# Patient Record
Sex: Female | Born: 1985
Health system: Southern US, Community
[De-identification: ages and names within clinical notes are randomized; demographics above are authoritative.]

## PROBLEM LIST (undated history)

## (undated) DIAGNOSIS — Z789 Other specified health status: Secondary | ICD-10-CM

## (undated) HISTORY — PX: NO PAST SURGERIES: SHX2092

## (undated) HISTORY — DX: Other specified health status: Z78.9

---

## 2013-02-05 ENCOUNTER — Encounter: Payer: Self-pay | Admitting: *Deleted

## 2013-02-12 ENCOUNTER — Ambulatory Visit (INDEPENDENT_AMBULATORY_CARE_PROVIDER_SITE_OTHER): Payer: Medicaid Other | Admitting: Family Medicine

## 2013-02-12 ENCOUNTER — Other Ambulatory Visit (HOSPITAL_COMMUNITY)
Admission: RE | Admit: 2013-02-12 | Discharge: 2013-02-12 | Disposition: A | Discharge status: Home / Self Care | Payer: Medicaid Other | Source: Ambulatory Visit | Attending: Family Medicine | Admitting: Family Medicine

## 2013-02-12 ENCOUNTER — Encounter: Payer: Self-pay | Admitting: Family Medicine

## 2013-02-12 VITALS — BP 128/86 | Temp 98.0°F | Ht 65.5 in | Wt 194.4 lb

## 2013-02-12 DIAGNOSIS — Z1151 Encounter for screening for human papillomavirus (HPV): Secondary | ICD-10-CM | POA: Insufficient documentation

## 2013-02-12 DIAGNOSIS — O09892 Supervision of other high risk pregnancies, second trimester: Secondary | ICD-10-CM

## 2013-02-12 DIAGNOSIS — Z23 Encounter for immunization: Secondary | ICD-10-CM

## 2013-02-12 DIAGNOSIS — O09212 Supervision of pregnancy with history of pre-term labor, second trimester: Secondary | ICD-10-CM

## 2013-02-12 DIAGNOSIS — O0990 Supervision of high risk pregnancy, unspecified, unspecified trimester: Secondary | ICD-10-CM | POA: Insufficient documentation

## 2013-02-12 DIAGNOSIS — Z01419 Encounter for gynecological examination (general) (routine) without abnormal findings: Secondary | ICD-10-CM | POA: Insufficient documentation

## 2013-02-12 DIAGNOSIS — O09219 Supervision of pregnancy with history of pre-term labor, unspecified trimester: Secondary | ICD-10-CM

## 2013-02-12 DIAGNOSIS — Z113 Encounter for screening for infections with a predominantly sexual mode of transmission: Secondary | ICD-10-CM | POA: Insufficient documentation

## 2013-02-12 LAB — HIV ANTIBODY (ROUTINE TESTING W REFLEX): HIV: NONREACTIVE

## 2013-02-12 LAB — POCT URINALYSIS DIP (DEVICE)
Glucose, UA: NEGATIVE mg/dL
Hgb urine dipstick: NEGATIVE
Ketones, ur: NEGATIVE mg/dL
Protein, ur: NEGATIVE mg/dL
Specific Gravity, Urine: 1.015 (ref 1.005–1.030)
Urobilinogen, UA: 0.2 mg/dL (ref 0.0–1.0)
pH: 7 (ref 5.0–8.0)

## 2013-02-12 MED ORDER — PRENATAL VITAMINS 0.8 MG PO TABS: 1.0000 | ORAL_TABLET | Freq: Every day | ORAL | Status: DC

## 2013-02-12 MED ORDER — HYDROXYPROGESTERONE CAPROATE 250 MG/ML IM OIL: 250.0000 mg | TOPICAL_OIL | INTRAMUSCULAR | Status: DC

## 2013-02-12 NOTE — Progress Notes (Signed)
   Subjective:    Cheryl Wilkerson is a [id number] [redacted]w[redacted]d being seen today for her first obstetrical visit.  Her obstetrical history is significant for prior PTB with twins at 24 wks, each baby weighed 1 lb, one survived.. Patient does intend to breast feed. Pregnancy history fully reviewed.  Patient reports no complaints.  Filed Vitals:   02/12/13 1013 02/12/13 1015  BP: 128/86   Temp: 98 F (36.7 C)   Height:  5' 5.5" (1.664 m)  Weight: 194 lb 6.4 oz (88.179 kg)     HISTORY: OB History  Gravida Para Term Preterm AB SAB TAB Ectopic Multiple Living  3 1  1 1 1   1 1     # Outcome Date GA Lbr Len/2nd Weight Sex Delivery Anes PTL Lv  3 CUR           2A PRE 12/23/11 [redacted]w[redacted]d   F SVD   Y     Comments: had premature rupture of membranes  2B  12/23/11 [redacted]w[redacted]d   F SVD   ND     Comments: died at 90 weeks of age  70 SAB              Past Medical History  Diagnosis Date  . Medical history non-contributory    Past Surgical History  Procedure Laterality Date  . No past surgeries     History reviewed. No pertinent family history.   Exam    Uterus:     Pelvic Exam: 24 wk size   Perineum: Normal Perineum   Vulva: Bartholin's, Urethra, Skene's normal   Vagina:  normal mucosa, normal discharge   Cervix: multiparous appearance   Adnexa: normal adnexa   Bony Pelvis: average  System: Breast:  normal appearance, no masses or tenderness   Skin: normal coloration and turgor, no rashes    Neurologic: oriented   Extremities: normal strength, tone, and muscle mass   HEENT sclera clear, anicteric   Mouth/Teeth mucous membranes moist, pharynx normal without lesions   Neck supple   Cardiovascular: regular rate and rhythm, no murmurs or gallops   Respiratory:  appears well, vitals normal, no respiratory distress, acyanotic, normal RR, ear and throat exam is normal, neck free of mass or lymphadenopathy, chest clear, no wheezing, crepitations, rhonchi, normal symmetric air entry   Abdomen: soft, non-tender;  bowel sounds normal; no masses,  no organomegaly      Assessment:    Pregnancy: [id number] Patient Active Problem List   Diagnosis Date Noted  . Pregnancy with history of pre-term labor 02/12/2013  . Supervision of high-risk pregnancy 02/12/2013        Plan:     Initial labs drawn. Prenatal vitamins. Problem list reviewed and updated. Genetic Screening discussed Quad Screen: too late.  Ultrasound discussed; fetal survey: ordered.  Follow up in 2 weeks. Offer and begin 17P Flu shot today SW and Nutrition New OB labs.     Esdras Delair S 02/12/2013

## 2013-02-12 NOTE — Progress Notes (Signed)
Pulse: 79

## 2013-02-12 NOTE — Patient Instructions (Signed)
Pregnancy - Second Trimester The second trimester of pregnancy (3 to 6 months) is a period of rapid growth for you and your baby. At the end of the sixth month, your baby is about 9 inches long and weighs 1 1/2 pounds. You will begin to feel the baby move between 18 and 20 weeks of the pregnancy. This is called quickening. Weight gain is faster. A clear fluid (colostrum) may leak out of your breasts. You may feel small contractions of the womb (uterus). This is known as false labor or Braxton-Hicks contractions. This is like a practice for labor when the baby is ready to be born. Usually, the problems with morning sickness have usually passed by the end of your first trimester. Some women develop small dark blotches (called cholasma, mask of pregnancy) on their face that usually goes away after the baby is born. Exposure to the sun makes the blotches worse. Acne may also develop in some pregnant women and pregnant women who have acne, may find that it goes away. PRENATAL EXAMS  Blood work may continue to be done during prenatal exams. These tests are done to check on your health and the probable health of your baby. Blood work is used to follow your blood levels (hemoglobin). Anemia (low hemoglobin) is common during pregnancy. Iron and vitamins are given to help prevent this. You will also be checked for diabetes between 24 and 28 weeks of the pregnancy. Some of the previous blood tests may be repeated.  The size of the uterus is measured during each visit. This is to make sure that the baby is continuing to grow properly according to the dates of the pregnancy.  Your blood pressure is checked every prenatal visit. This is to make sure you are not getting toxemia.  Your urine is checked to make sure you do not have an infection, diabetes or protein in the urine.  Your weight is checked often to make sure gains are happening at the suggested rate. This is to ensure that both you and your baby are  growing normally.  Sometimes, an ultrasound is performed to confirm the proper growth and development of the baby. This is a test which bounces harmless sound waves off the baby so your caregiver can more accurately determine due dates. Sometimes, a test is done on the amniotic fluid surrounding the baby. This test is called an amniocentesis. The amniotic fluid is obtained by sticking a needle into the belly (abdomen). This is done to check the chromosomes in instances where there is a concern about possible genetic problems with the baby. It is also sometimes done near the end of pregnancy if an early delivery is required. In this case, it is done to help make sure the baby's lungs are mature enough for the baby to live outside of the womb. CHANGES OCCURING IN THE SECOND TRIMESTER OF PREGNANCY Your body goes through many changes during pregnancy. They vary from person to person. Talk to your caregiver about changes you notice that you are concerned about.  During the second trimester, you will likely have an increase in your appetite. It is normal to have cravings for certain foods. This varies from person to person and pregnancy to pregnancy.  Your lower abdomen will begin to bulge.  You may have to urinate more often because the uterus and baby are pressing on your bladder. It is also common to get more bladder infections during pregnancy. You can help this by drinking lots of fluids   and emptying your bladder before and after intercourse.  You may begin to get stretch marks on your hips, abdomen, and breasts. These are normal changes in the body during pregnancy. There are no exercises or medicines to take that prevent this change.  You may begin to develop swollen and bulging veins (varicose veins) in your legs. Wearing support hose, elevating your feet for 15 minutes, 3 to 4 times a day and limiting salt in your diet helps lessen the problem.  Heartburn may develop as the uterus grows and  pushes up against the stomach. Antacids recommended by your caregiver helps with this problem. Also, eating smaller meals 4 to 5 times a day helps.  Constipation can be treated with a stool softener or adding bulk to your diet. Drinking lots of fluids, and eating vegetables, fruits, and whole grains are helpful.  Exercising is also helpful. If you have been very active up until your pregnancy, most of these activities can be continued during your pregnancy. If you have been less active, it is helpful to start an exercise program such as walking.  Hemorrhoids may develop at the end of the second trimester. Warm sitz baths and hemorrhoid cream recommended by your caregiver helps hemorrhoid problems.  Backaches may develop during this time of your pregnancy. Avoid heavy lifting, wear low heal shoes, and practice good posture to help with backache problems.  Some pregnant women develop tingling and numbness of their hand and fingers because of swelling and tightening of ligaments in the wrist (carpel tunnel syndrome). This goes away after the baby is born.  As your breasts enlarge, you may have to get a bigger bra. Get a comfortable, cotton, support bra. Do not get a nursing bra until the last month of the pregnancy if you will be nursing the baby.  You may get a dark line from your belly button to the pubic area called the linea nigra.  You may develop rosy cheeks because of increase blood flow to the face.  You may develop spider looking lines of the face, neck, arms, and chest. These go away after the baby is born. HOME CARE INSTRUCTIONS   It is extremely important to avoid all smoking, herbs, alcohol, and unprescribed drugs during your pregnancy. These chemicals affect the formation and growth of the baby. Avoid these chemicals throughout the pregnancy to ensure the delivery of a healthy infant.  Most of your home care instructions are the same as suggested for the first trimester of your  pregnancy. Keep your caregiver's appointments. Follow your caregiver's instructions regarding medicine use, exercise, and diet.  During pregnancy, you are providing food for you and your baby. Continue to eat regular, well-balanced meals. Choose foods such as meat, fish, milk and other low fat dairy products, vegetables, fruits, and whole-grain breads and cereals. Your caregiver will tell you of the ideal weight gain.  A physical sexual relationship may be continued up until near the end of pregnancy if there are no other problems. Problems could include early (premature) leaking of amniotic fluid from the membranes, vaginal bleeding, abdominal pain, or other medical or pregnancy problems.  Exercise regularly if there are no restrictions. Check with your caregiver if you are unsure of the safety of some of your exercises. The greatest weight gain will occur in the last 2 trimesters of pregnancy. Exercise will help you:  Control your weight.  Get you in shape for labor and delivery.  Lose weight after you have the baby.  Wear   a good support or jogging bra for breast tenderness during pregnancy. This may help if worn during sleep. Pads or tissues may be used in the bra if you are leaking colostrum.  Do not use hot tubs, steam rooms or saunas throughout the pregnancy.  Wear your seat belt at all times when driving. This protects you and your baby if you are in an accident.  Avoid raw meat, uncooked cheese, cat litter boxes, and soil used by cats. These carry germs that can cause birth defects in the baby.  The second trimester is also a good time to visit your dentist for your dental health if this has not been done yet. Getting your teeth cleaned is okay. Use a soft toothbrush. Brush gently during pregnancy.  It is easier to leak urine during pregnancy. Tightening up and strengthening the pelvic muscles will help with this problem. Practice stopping your urination while you are going to the  bathroom. These are the same muscles you need to strengthen. It is also the muscles you would use as if you were trying to stop from passing gas. You can practice tightening these muscles up 10 times a set and repeating this about 3 times per day. Once you know what muscles to tighten up, do not perform these exercises during urination. It is more likely to contribute to an infection by backing up the urine.  Ask for help if you have financial, counseling, or nutritional needs during pregnancy. Your caregiver will be able to offer counseling for these needs as well as refer you for other special needs.  Your skin may become oily. If so, wash your face with mild soap, use non-greasy moisturizer and oil or cream based makeup. MEDICINES AND DRUG USE IN PREGNANCY  Take prenatal vitamins as directed. The vitamin should contain 1 milligram of folic acid. Keep all vitamins out of reach of children. Only a couple vitamins or tablets containing iron may be fatal to a baby or young child when ingested.  Avoid use of all medicines, including herbs, over-the-counter medicines, not prescribed or suggested by your caregiver. Only take over-the-counter or prescription medicines for pain, discomfort, or fever as directed by your caregiver. Do not use aspirin.  Let your caregiver also know about herbs you may be using.  Alcohol is related to a number of birth defects. This includes fetal alcohol syndrome. All alcohol, in any form, should be avoided completely. Smoking will cause low birth rate and premature babies.  Street or illegal drugs are very harmful to the baby. They are absolutely forbidden. A baby born to an addicted mother will be addicted at birth. The baby will go through the same withdrawal an adult does. SEEK MEDICAL CARE IF:  You have any concerns or worries during your pregnancy. It is better to call with your questions if you feel they cannot wait, rather than worry about them. SEEK IMMEDIATE  MEDICAL CARE IF:   An unexplained oral temperature above 102 F (38.9 C) develops, or as your caregiver suggests.  You have leaking of fluid from the vagina (birth canal). If leaking membranes are suspected, take your temperature and tell your caregiver of this when you call.  There is vaginal spotting, bleeding, or passing clots. Tell your caregiver of the amount and how many pads are used. Light spotting in pregnancy is common, especially following intercourse.  You develop a bad smelling vaginal discharge with a change in the color from clear to white.  You continue to feel   sick to your stomach (nauseated) and have no relief from remedies suggested. You vomit blood or coffee ground-like materials.  You lose more than 2 pounds of weight or gain more than 2 pounds of weight over 1 week, or as suggested by your caregiver.  You notice swelling of your face, hands, feet, or legs.  You get exposed to German measles and have never had them.  You are exposed to fifth disease or chickenpox.  You develop belly (abdominal) pain. Round ligament discomfort is a common non-cancerous (benign) cause of abdominal pain in pregnancy. Your caregiver still must evaluate you.  You develop a bad headache that does not go away.  You develop fever, diarrhea, pain with urination, or shortness of breath.  You develop visual problems, blurry, or double vision.  You fall or are in a car accident or any kind of trauma.  There is mental or physical violence at home. Document Released: 04/17/2001 Document Revised: 01/16/2012 Document Reviewed: 10/20/2008 [employer] Patient Information 2014 [employer].  Breastfeeding A change in hormones during your pregnancy causes growth of your breast tissue and an increase in number and size of milk ducts. The hormone prolactin allows proteins, sugars, and fats from your blood supply to make breast milk in your milk-producing glands. The hormone progesterone prevents  breast milk from being released before the birth of your baby. After the birth of your baby, your progesterone level decreases allowing breast milk to be released. Thoughts of your baby, as well as his or her sucking or crying, can stimulate the release of milk from the milk-producing glands. Deciding to breastfeed (nurse) is one of the best choices you can make for you and your baby. The information that follows gives a brief review of the benefits, as well as other important skills to know about breastfeeding. BENEFITS OF BREASTFEEDING For your baby  The first milk (colostrum) helps your baby's digestive system function better.   There are antibodies in your milk that help your baby fight off infections.   Your baby has a lower incidence of asthma, allergies, and sudden infant death syndrome (SIDS).   The nutrients in breast milk are better for your baby than infant formulas.  Breast milk improves your baby's brain development.   Your baby will have less gas, colic, and constipation.  Your baby is less likely to develop other conditions, such as childhood obesity, asthma, or diabetes mellitus. For you  Breastfeeding helps develop a very special bond between you and your baby.   Breastfeeding is convenient, always available at the correct temperature, and costs nothing.   Breastfeeding helps to burn calories and helps you lose the weight gained during pregnancy.   Breastfeeding makes your uterus contract back down to normal size faster and slows bleeding following delivery.   Breastfeeding mothers have a lower risk of developing osteoporosis or breast or ovarian cancer later in life.  BREASTFEEDING FREQUENCY  A healthy, full-term baby may breastfeed as often as every hour or space his or her feedings to every 3 hours. Breastfeeding frequency will vary from baby to baby.   Newborns should be fed no less than every 2 3 hours during the day and every 4 5 hours during the  night. You should breastfeed a minimum of 8 feedings in a 24 hour period.  Awaken your baby to breastfeed if it has been 3 4 hours since the last feeding.  Breastfeed when you feel the need to reduce the fullness of your breasts or when   your newborn shows signs of hunger. Signs that your baby may be hungry include:  Increased alertness or activity.  Stretching.  Movement of the head from side to side.  Movement of the head and opening of the mouth when the corner of the mouth or cheek is stroked (rooting).  Increased sucking sounds, smacking lips, cooing, sighing, or squeaking.  Hand-to-mouth movements.  Increased sucking of fingers or hands.  Fussing.  Intermittent crying.  Signs of extreme hunger will require calming and consoling before you try to feed your baby. Signs of extreme hunger may include:  Restlessness.  A loud, strong cry.  Screaming.  Frequent feeding will help you make more milk and will help prevent problems, such as sore nipples and engorgement of the breasts.  BREASTFEEDING   Whether lying down or sitting, be sure that the baby's abdomen is facing your abdomen.   Support your breast with 4 fingers under your breast and your thumb above your nipple. Make sure your fingers are well away from your nipple and your baby's mouth.   Stroke your baby's lips gently with your finger or nipple.   When your baby's mouth is open wide enough, place all of your nipple and as much of the colored area around your nipple (areola) as possible into your baby's mouth.  More areola should be visible above his or her upper lip than below his or her lower lip.  Your baby's tongue should be between his or her lower gum and your breast.  Ensure that your baby's mouth is correctly positioned around the nipple (latched). Your baby's lips should create a seal on your breast.  Signs that your baby has effectively latched onto your nipple include:  Tugging or sucking  without pain.  Swallowing heard between sucks.  Absent click or smacking sound.  Muscle movement above and in front of his or her ears with sucking.  Your baby must suck about 2 3 minutes in order to get your milk. Allow your baby to feed on each breast as long as he or she wants. Nurse your baby until he or she unlatches or falls asleep at the first breast, then offer the second breast.  Signs that your baby is full and satisfied include:  A gradual decrease in the number of sucks or complete cessation of sucking.  Falling asleep.  Extension or relaxation of his or her body.  Retention of a small amount of milk in his or her mouth.  Letting go of your breast by himself or herself.  Signs of effective breastfeeding in you include:  Breasts that have increased firmness, weight, and size prior to feeding.  Breasts that are softer after nursing.  Increased milk volume, as well as a change in milk consistency and color by the 5th day of breastfeeding.  Breast fullness relieved by breastfeeding.  Nipples are not sore, cracked, or bleeding.  If needed, break the suction by putting your finger into the corner of your baby's mouth and sliding your finger between his or her gums. Then, remove your breast from his or her mouth.  It is common for babies to spit up a small amount after a feeding.  Babies often swallow air during feeding. This can make babies fussy. Burping your baby between breasts can help with this.  Vitamin D supplements are recommended for babies who get only breast milk.  Avoid using a pacifier during your baby's first 4 6 weeks.  Avoid supplemental feedings of water, formula, or   juice in place of breastfeeding. Breast milk is all the food your baby needs. It is not necessary for your baby to have water or formula. Your breasts will make more milk if supplemental feedings are avoided during the early weeks. HOW TO TELL WHETHER YOUR BABY IS GETTING ENOUGH BREAST  MILK Wondering whether or not your baby is getting enough milk is a common concern among mothers. You can be assured that your baby is getting enough milk if:   Your baby is actively sucking and you hear swallowing.   Your baby seems relaxed and satisfied after a feeding.   Your baby nurses at least 8 12 times in a 24 hour time period.  During the first 3 5 days of age:  Your baby is wetting at least 3 5 diapers in a 24 hour period. The urine should be clear and pale yellow.  Your baby is having at least 3 4 stools in a 24 hour period. The stool should be soft and yellow.  At 5 7 days of age, your baby is having at least 3 6 stools in a 24 hour period. The stool should be seedy and yellow by 5 days of age.  Your baby has a weight loss less than 7 10% during the first 3 days of age.  Your baby does not lose weight after 3 7 days of age.  Your baby gains 4 7 ounces each week after he or she is 4 days of age.  Your baby gains weight by 5 days of age and is back to birth weight within 2 weeks. ENGORGEMENT In the first week after your baby is born, you may experience extremely full breasts (engorgement). When engorged, your breasts may feel heavy, warm, or tender to the touch. Engorgement peaks within 24 48 hours after delivery of your baby.  Engorgement may be reduced by:  Continuing to breastfeed.  Increasing the frequency of breastfeeding.  Taking warm showers or applying warm, moist heat to your breasts just before each feeding. This increases circulation and helps the milk flow.   Gently massaging your breast before and during the feedings. With your fingertips, massage from your chest wall towards your nipple in a circular motion.   Ensuring that your baby empties at least one breast at every feeding. It also helps to start the next feeding on the opposite breast.   Expressing breast milk by hand or by using a breast pump to empty the breasts if your baby is sleepy, or  not nursing well. You may also want to express milk if you are returning to work oryou feel you are getting engorged.  Ensuring your baby is latched on and positioned properly while breastfeeding. If you follow these suggestions, your engorgement should improve in 24 48 hours. If you are still experiencing difficulty, call your lactation consultant or caregiver.  CARING FOR YOURSELF Take care of your breasts.  Bathe or shower daily.   Avoid using soap on your nipples.   Wear a supportive bra. Avoid wearing underwire style bras.  Air dry your nipples for a 3 4minutes after each feeding.   Use only cotton bra pads to absorb breast milk leakage. Leaking of breast milk between feedings is normal.   Use only pure lanolin on your nipples after nursing. You do not need to wash it off before feeding your baby again. Another option is to express a few drops of breast milk and gently massage that milk into your nipples.  Continue   breast self-awareness checks. Take care of yourself.  Eat healthy foods. Alternate 3 meals with 3 snacks.  Avoid foods that you notice affect your baby in a bad way.  Drink milk, fruit juice, and water to satisfy your thirst (about 8 glasses a day).   Rest often, relax, and take your prenatal vitamins to prevent fatigue, stress, and anemia.  Avoid chewing and smoking tobacco.  Avoid alcohol and drug use.  Take over-the-counter and prescribed medicine only as directed by your caregiver or pharmacist. You should always check with your caregiver or pharmacist before taking any new medicine, vitamin, or herbal supplement.  Know that pregnancy is possible while breastfeeding. If desired, talk to your caregiver about family planning and safe birth control methods that may be used while breastfeeding. SEEK MEDICAL CARE IF:   You feel like you want to stop breastfeeding or have become frustrated with breastfeeding.  You have painful breasts or nipples.  Your  nipples are cracked or bleeding.  Your breasts are red, tender, or warm.  You have a swollen area on either breast.  You have a fever or chills.  You have nausea or vomiting.  You have drainage from your nipples.  Your breasts do not become full before feedings by the 5th day after delivery.  You feel sad and depressed.  Your baby is too sleepy to eat well.  Your baby is having trouble sleeping.   Your baby is wetting less than 3 diapers in a 24 hour period.  Your baby has less than 3 stools in a 24 hour period.  Your baby's skin or the white part of his or her eyes becomes more yellow.   Your baby is not gaining weight by 5 days of age. MAKE SURE YOU:   Understand these instructions.  Will watch your condition.  Will get help right away if you are not doing well or get worse. Document Released: 04/23/2005 Document Revised: 01/16/2012 Document Reviewed: 11/28/2011 [employer] Patient Information 2014 [employer].  

## 2013-02-12 NOTE — Progress Notes (Signed)
Nutrition: Pt here for first OB visit today. Pt was obese pregavid.  Wt gain is WNL today. No N/V reported.  No reported food allergies. Pt reports good appetite and adequate intake- eating 3 meals/day. Pt walking for one hour most days. No PNV yet.  Discussed wt gain goal of 11-20# or 0.5#/week. Agrees to continue healthy diet and exercise as reported.   Pt already has [facility]. F/u if referred.  Melanee Left, MPH, RD, LDN 02/12/2013

## 2013-02-12 NOTE — Progress Notes (Signed)
U/S scheduled 02/16/13 at 2 pm.

## 2013-02-13 ENCOUNTER — Encounter: Payer: Self-pay | Admitting: *Deleted

## 2013-02-13 LAB — OBSTETRIC PANEL
Basophils Absolute: 0 10*3/uL (ref 0.0–0.1)
Basophils Relative: 0 % (ref 0–1)
Eosinophils Absolute: 0.1 10*3/uL (ref 0.0–0.7)
Eosinophils Relative: 1 % (ref 0–5)
HCT: 36.1 % (ref 36.0–46.0)
Hemoglobin: 12.6 g/dL (ref 12.0–15.0)
Hepatitis B Surface Ag: NEGATIVE
MCH: 28.6 pg (ref 26.0–34.0)
MCHC: 34.9 g/dL (ref 30.0–36.0)
MCV: 82 fL (ref 78.0–100.0)
Monocytes Relative: 6 % (ref 3–12)
Neutrophils Relative %: 68 % (ref 43–77)
Platelets: 272 10*3/uL (ref 150–400)
RBC: 4.4 MIL/uL (ref 3.87–5.11)
RDW: 14.1 % (ref 11.5–15.5)
Rh Type: POSITIVE

## 2013-02-14 LAB — CULTURE, OB URINE

## 2013-02-16 ENCOUNTER — Ambulatory Visit (HOSPITAL_COMMUNITY)
Admission: RE | Admit: 2013-02-16 | Discharge: 2013-02-16 | Disposition: A | Discharge status: Home / Self Care | Payer: Medicaid Other | Source: Ambulatory Visit | Attending: Family Medicine | Admitting: Family Medicine

## 2013-02-16 ENCOUNTER — Ambulatory Visit (HOSPITAL_COMMUNITY): Admission: RE | Admit: 2013-02-16 | Payer: Medicaid Other | Source: Ambulatory Visit

## 2013-02-16 DIAGNOSIS — Z363 Encounter for antenatal screening for malformations: Secondary | ICD-10-CM | POA: Insufficient documentation

## 2013-02-16 DIAGNOSIS — O0930 Supervision of pregnancy with insufficient antenatal care, unspecified trimester: Secondary | ICD-10-CM | POA: Insufficient documentation

## 2013-02-16 DIAGNOSIS — O09892 Supervision of other high risk pregnancies, second trimester: Secondary | ICD-10-CM

## 2013-02-16 DIAGNOSIS — O358XX0 Maternal care for other (suspected) fetal abnormality and damage, not applicable or unspecified: Secondary | ICD-10-CM | POA: Insufficient documentation

## 2013-02-16 DIAGNOSIS — O0990 Supervision of high risk pregnancy, unspecified, unspecified trimester: Secondary | ICD-10-CM

## 2013-02-16 DIAGNOSIS — Z1389 Encounter for screening for other disorder: Secondary | ICD-10-CM | POA: Insufficient documentation

## 2013-02-16 DIAGNOSIS — Z23 Encounter for immunization: Secondary | ICD-10-CM

## 2013-02-16 DIAGNOSIS — Z8751 Personal history of pre-term labor: Secondary | ICD-10-CM | POA: Insufficient documentation

## 2013-02-16 DIAGNOSIS — O09212 Supervision of pregnancy with history of pre-term labor, second trimester: Secondary | ICD-10-CM

## 2013-02-17 LAB — HEMOGLOBINOPATHY EVALUATION
Hemoglobin Other: 0 %
Hgb A2 Quant: 3 % (ref 2.2–3.2)
Hgb A: 97 % (ref 96.8–97.8)
Hgb F Quant: 0 % (ref 0.0–2.0)
Hgb S Quant: 0 %

## 2013-02-19 ENCOUNTER — Ambulatory Visit: Payer: Medicaid Other

## 2013-02-19 ENCOUNTER — Ambulatory Visit (INDEPENDENT_AMBULATORY_CARE_PROVIDER_SITE_OTHER): Payer: Medicaid Other | Admitting: *Deleted

## 2013-02-19 VITALS — BP 142/84 | HR 91 | Temp 97.2°F | Wt 197.5 lb

## 2013-02-19 DIAGNOSIS — O09212 Supervision of pregnancy with history of pre-term labor, second trimester: Secondary | ICD-10-CM

## 2013-02-19 DIAGNOSIS — O09219 Supervision of pregnancy with history of pre-term labor, unspecified trimester: Secondary | ICD-10-CM

## 2013-02-19 MED ORDER — HYDROXYPROGESTERONE CAPROATE 250 MG/ML IM OIL
250.0000 mg | TOPICAL_OIL | Freq: Once | INTRAMUSCULAR | Status: AC
  Administered 2013-02-19: 250 mg via INTRAMUSCULAR

## 2013-02-23 ENCOUNTER — Encounter: Payer: Self-pay | Admitting: *Deleted

## 2013-02-26 ENCOUNTER — Ambulatory Visit (INDEPENDENT_AMBULATORY_CARE_PROVIDER_SITE_OTHER): Payer: Medicaid Other | Admitting: Obstetrics & Gynecology

## 2013-02-26 VITALS — BP 125/83 | Temp 97.0°F | Wt 193.9 lb

## 2013-02-26 DIAGNOSIS — O09219 Supervision of pregnancy with history of pre-term labor, unspecified trimester: Secondary | ICD-10-CM

## 2013-02-26 DIAGNOSIS — O09212 Supervision of pregnancy with history of pre-term labor, second trimester: Secondary | ICD-10-CM

## 2013-02-26 LAB — POCT URINALYSIS DIP (DEVICE)
Bilirubin Urine: NEGATIVE
Ketones, ur: NEGATIVE mg/dL
Protein, ur: NEGATIVE mg/dL
Specific Gravity, Urine: 1.015 (ref 1.005–1.030)
pH: 7 (ref 5.0–8.0)

## 2013-02-26 MED ORDER — HYDROXYPROGESTERONE CAPROATE 250 MG/ML IM OIL
250.0000 mg | TOPICAL_OIL | INTRAMUSCULAR | Status: DC
  Administered 2013-02-26 – 2013-04-27 (×8): 250 mg via INTRAMUSCULAR

## 2013-02-26 NOTE — Patient Instructions (Signed)

## 2013-02-26 NOTE — Progress Notes (Signed)
17 p today. No UC, some llq pain when walking

## 2013-02-26 NOTE — Progress Notes (Signed)
Pulse- 90  Pain-pressure

## 2013-03-04 ENCOUNTER — Encounter: Payer: Self-pay | Admitting: *Deleted

## 2013-03-05 ENCOUNTER — Ambulatory Visit (INDEPENDENT_AMBULATORY_CARE_PROVIDER_SITE_OTHER): Payer: Medicaid Other

## 2013-03-05 VITALS — BP 120/85 | HR 86 | Temp 97.3°F | Wt 195.4 lb

## 2013-03-05 DIAGNOSIS — O09219 Supervision of pregnancy with history of pre-term labor, unspecified trimester: Secondary | ICD-10-CM

## 2013-03-05 DIAGNOSIS — O09212 Supervision of pregnancy with history of pre-term labor, second trimester: Secondary | ICD-10-CM

## 2013-03-05 NOTE — Progress Notes (Signed)
Interpreter present with patient. Pt. States she is feeling the baby move good and all the time. Denies any concerns at this tine. Pt. To get 1hr at next visit. Tolerated 17P injection well.

## 2013-03-12 ENCOUNTER — Ambulatory Visit (INDEPENDENT_AMBULATORY_CARE_PROVIDER_SITE_OTHER): Payer: Medicaid Other | Admitting: Family

## 2013-03-12 ENCOUNTER — Encounter: Payer: Self-pay | Admitting: Family

## 2013-03-12 VITALS — BP 121/79 | Wt 199.8 lb

## 2013-03-12 DIAGNOSIS — O0990 Supervision of high risk pregnancy, unspecified, unspecified trimester: Secondary | ICD-10-CM

## 2013-03-12 DIAGNOSIS — Z23 Encounter for immunization: Secondary | ICD-10-CM

## 2013-03-12 DIAGNOSIS — O09219 Supervision of pregnancy with history of pre-term labor, unspecified trimester: Secondary | ICD-10-CM

## 2013-03-12 DIAGNOSIS — O09213 Supervision of pregnancy with history of pre-term labor, third trimester: Secondary | ICD-10-CM

## 2013-03-12 LAB — POCT URINALYSIS DIP (DEVICE)
Nitrite: NEGATIVE
Protein, ur: NEGATIVE mg/dL
Specific Gravity, Urine: 1.02 (ref 1.005–1.030)
Urobilinogen, UA: 0.2 mg/dL (ref 0.0–1.0)

## 2013-03-12 LAB — CBC
HCT: 35 % — ABNORMAL LOW (ref 36.0–46.0)
MCHC: 34 g/dL (ref 30.0–36.0)
Platelets: 279 10*3/uL (ref 150–400)
RBC: 4.21 MIL/uL (ref 3.87–5.11)
RDW: 15 % (ref 11.5–15.5)

## 2013-03-12 MED ORDER — NITROFURANTOIN MONOHYD MACRO 100 MG PO CAPS: 100.0000 mg | ORAL_CAPSULE | Freq: Two times a day (BID) | ORAL | Status: DC

## 2013-03-12 MED ORDER — TETANUS-DIPHTH-ACELL PERTUSSIS 5-2.5-18.5 LF-MCG/0.5 IM SUSP: 0.5000 mL | Freq: Once | INTRAMUSCULAR | Status: DC

## 2013-03-12 NOTE — Progress Notes (Signed)
U/S scheduled 03/16/13 at 1 15pm.

## 2013-03-12 NOTE — Progress Notes (Signed)
Pulse: 90

## 2013-03-12 NOTE — Progress Notes (Signed)
No questions or concerns; with interpreter.  Pt denies UTI symptoms.  Mod leuks in urine > RX Macrobid, urine culture sent.  dTap and 17p today.  1 hr lab obtained.

## 2013-03-13 LAB — HIV ANTIBODY (ROUTINE TESTING W REFLEX): HIV: NONREACTIVE

## 2013-03-13 LAB — GLUCOSE TOLERANCE, 1 HOUR (50G) W/O FASTING: Glucose, 1 Hour GTT: 167 mg/dL — ABNORMAL HIGH (ref 70–140)

## 2013-03-14 LAB — CULTURE, OB URINE: Colony Count: 5000

## 2013-03-16 ENCOUNTER — Ambulatory Visit (HOSPITAL_COMMUNITY): Admission: RE | Admit: 2013-03-16 | Payer: Medicaid Other | Source: Ambulatory Visit

## 2013-03-16 ENCOUNTER — Ambulatory Visit (HOSPITAL_COMMUNITY)
Admission: RE | Admit: 2013-03-16 | Discharge: 2013-03-16 | Disposition: A | Discharge status: Home / Self Care | Payer: Medicaid Other | Source: Ambulatory Visit | Attending: Family | Admitting: Family

## 2013-03-16 DIAGNOSIS — Z3689 Encounter for other specified antenatal screening: Secondary | ICD-10-CM | POA: Insufficient documentation

## 2013-03-16 DIAGNOSIS — O0990 Supervision of high risk pregnancy, unspecified, unspecified trimester: Secondary | ICD-10-CM

## 2013-03-16 DIAGNOSIS — O0930 Supervision of pregnancy with insufficient antenatal care, unspecified trimester: Secondary | ICD-10-CM | POA: Insufficient documentation

## 2013-03-16 DIAGNOSIS — Z8751 Personal history of pre-term labor: Secondary | ICD-10-CM | POA: Insufficient documentation

## 2013-03-18 ENCOUNTER — Encounter: Payer: Self-pay | Admitting: Family

## 2013-03-19 ENCOUNTER — Ambulatory Visit (INDEPENDENT_AMBULATORY_CARE_PROVIDER_SITE_OTHER): Payer: Medicaid Other

## 2013-03-19 VITALS — BP 130/85 | HR 100 | Temp 98.1°F | Wt 203.3 lb

## 2013-03-19 DIAGNOSIS — O09219 Supervision of pregnancy with history of pre-term labor, unspecified trimester: Secondary | ICD-10-CM

## 2013-03-19 DIAGNOSIS — O09212 Supervision of pregnancy with history of pre-term labor, second trimester: Secondary | ICD-10-CM

## 2013-03-26 ENCOUNTER — Ambulatory Visit (INDEPENDENT_AMBULATORY_CARE_PROVIDER_SITE_OTHER): Payer: Medicaid Other | Admitting: Obstetrics & Gynecology

## 2013-03-26 ENCOUNTER — Encounter: Payer: Self-pay | Admitting: *Deleted

## 2013-03-26 VITALS — BP 131/84 | Temp 96.7°F | Wt 197.5 lb

## 2013-03-26 DIAGNOSIS — O09219 Supervision of pregnancy with history of pre-term labor, unspecified trimester: Secondary | ICD-10-CM

## 2013-03-26 DIAGNOSIS — O0990 Supervision of high risk pregnancy, unspecified, unspecified trimester: Secondary | ICD-10-CM

## 2013-03-26 LAB — POCT URINALYSIS DIP (DEVICE)
Glucose, UA: NEGATIVE mg/dL
Nitrite: NEGATIVE
Protein, ur: NEGATIVE mg/dL
Specific Gravity, Urine: 1.015 (ref 1.005–1.030)
Urobilinogen, UA: 0.2 mg/dL (ref 0.0–1.0)
pH: 7 (ref 5.0–8.0)

## 2013-03-26 NOTE — Progress Notes (Signed)
P=100,  [employer]. Informed patient needs 3hr gtt asap. States feels baby move more somedays than others.

## 2013-03-26 NOTE — Progress Notes (Signed)
Needs f/u US 2 weeks for RVOT view per Dr. Claudean Severance. 3 hr GTT

## 2013-03-26 NOTE — Patient Instructions (Signed)
Preterm Labor Information Preterm labor is when labor starts at less than 37 weeks of pregnancy. The normal length of a pregnancy is 39 to 41 weeks. CAUSES Often, there is no identifiable underlying cause as to why a woman goes into preterm labor. One of the most common known causes of preterm labor is infection. Infections of the uterus, cervix, vagina, amniotic sac, bladder, kidney, or even the lungs (pneumonia) can cause labor to start. Other suspected causes of preterm labor include:   Urogenital infections, such as yeast infections and bacterial vaginosis.   Uterine abnormalities (uterine shape, uterine septum, fibroids, or bleeding from the placenta).   A cervix that has been operated on (it may fail to stay closed).   Malformations in the fetus.   Multiple gestations (twins, triplets, and so on).   Breakage of the amniotic sac.  RISK FACTORS  Having a previous history of preterm labor.   Having premature rupture of membranes (PROM).   Having a placenta that covers the opening of the cervix (placenta previa).   Having a placenta that separates from the uterus (placental abruption).   Having a cervix that is too weak to hold the fetus in the uterus (incompetent cervix).   Having too much fluid in the amniotic sac (polyhydramnios).   Taking illegal drugs or smoking while pregnant.   Not gaining enough weight while pregnant.   Being younger than 18 and older than 28 years old.   Having a low socioeconomic status.   Being African American. SYMPTOMS Signs and symptoms of preterm labor include:   Menstrual-like cramps, abdominal pain, or back pain.  Uterine contractions that are regular, as frequent as six in an hour, regardless of their intensity (may be mild or painful).  Contractions that start on the top of the uterus and spread down to the lower abdomen and back.   A sense of increased pelvic pressure.   A watery or bloody mucus discharge that  comes from the vagina.  TREATMENT Depending on the length of the pregnancy and other circumstances, your health care provider may suggest bed rest. If necessary, there are medicines that can be given to stop contractions and to mature the fetal lungs. If labor happens before 34 weeks of pregnancy, a prolonged hospital stay may be recommended. Treatment depends on the condition of both you and the fetus.  WHAT SHOULD YOU DO IF YOU THINK YOU ARE IN PRETERM LABOR? Call your health care provider right away. You will need to go to the hospital to get checked immediately. HOW CAN YOU PREVENT PRETERM LABOR IN FUTURE PREGNANCIES? You should:   Stop smoking if you smoke.  Maintain healthy weight gain and avoid chemicals and drugs that are not necessary.  Be watchful for any type of infection.  Inform your health care provider if you have a known history of preterm labor. Document Released: 07/14/2003 Document Revised: 12/24/2012 Document Reviewed: 05/26/2012 [employer] Patient Information 2014 [employer].    

## 2013-03-31 ENCOUNTER — Other Ambulatory Visit: Payer: Medicaid Other

## 2013-03-31 ENCOUNTER — Encounter: Payer: Self-pay | Admitting: Family Medicine

## 2013-03-31 DIAGNOSIS — O99810 Abnormal glucose complicating pregnancy: Secondary | ICD-10-CM

## 2013-03-31 LAB — GLUCOSE TOLERANCE, 3 HOURS
Glucose Tolerance, Fasting: 75 mg/dL (ref 70–104)
Glucose, GTT - 3 Hour: 125 mg/dL (ref 70–144)

## 2013-04-01 ENCOUNTER — Telehealth: Payer: Self-pay | Admitting: *Deleted

## 2013-04-01 NOTE — Telephone Encounter (Signed)
Called Cheryl Wilkerson with [facility] interpreters and explained to her she had one elevated level on her 3 hr Gtt so is not GDM ,but is reccomended to follow diabetic diet for the remainder of her pregnancy. She agreed to come for diabetic diet teaching 04/06/13 0830.[name] voices understanding.

## 2013-04-01 NOTE — Telephone Encounter (Signed)
Message copied by Gerome Apley on Wed Apr 01, 2013  8:31 AM ------      Message from: Reva Bores      Created: Tue Mar 31, 2013 10:57 PM       Please give diabetic diet to pt. ------

## 2013-04-06 ENCOUNTER — Ambulatory Visit (INDEPENDENT_AMBULATORY_CARE_PROVIDER_SITE_OTHER): Payer: Medicaid Other | Admitting: General Practice

## 2013-04-06 VITALS — BP 117/77 | HR 88 | Temp 96.8°F | Ht 65.0 in | Wt 199.4 lb

## 2013-04-06 DIAGNOSIS — Z7189 Other specified counseling: Secondary | ICD-10-CM

## 2013-04-06 DIAGNOSIS — O09219 Supervision of pregnancy with history of pre-term labor, unspecified trimester: Secondary | ICD-10-CM

## 2013-04-06 NOTE — Progress Notes (Signed)
Nutrition note: f/u GDM diet education Pt had 1 elevated value for her 3hr GTT so has been told to follow the GDM diet. Pt has gained 14.4# @ [redacted]w[redacted]d, which is wnl. Pt reports eating 2x/d (8am and 4pm) and drinks only water and juice. Pt reports walking ~1hr most days. Pt received verbal & written education via interpreter about GDM diet. Encouraged pt to add a snack/ meal during mid-day. Encouraged no juice or fruit before 10am. Discussed portion sizes. Discussed wt gain goals of 11-20# or 0.5#/wk. Pt agrees to follow GDM diet with proper CHO/ protein combination.  F/u in 2-4 wks Blondell Reveal, MS, RD, LDN, [facility]

## 2013-04-10 ENCOUNTER — Ambulatory Visit (HOSPITAL_COMMUNITY)
Admission: RE | Admit: 2013-04-10 | Discharge: 2013-04-10 | Disposition: A | Discharge status: Home / Self Care | Payer: Medicaid Other | Source: Ambulatory Visit | Attending: Obstetrics & Gynecology | Admitting: Obstetrics & Gynecology

## 2013-04-10 DIAGNOSIS — Z3689 Encounter for other specified antenatal screening: Secondary | ICD-10-CM | POA: Insufficient documentation

## 2013-04-10 DIAGNOSIS — Z8751 Personal history of pre-term labor: Secondary | ICD-10-CM | POA: Insufficient documentation

## 2013-04-10 DIAGNOSIS — O0990 Supervision of high risk pregnancy, unspecified, unspecified trimester: Secondary | ICD-10-CM

## 2013-04-10 DIAGNOSIS — O0930 Supervision of pregnancy with insufficient antenatal care, unspecified trimester: Secondary | ICD-10-CM | POA: Insufficient documentation

## 2013-04-13 ENCOUNTER — Ambulatory Visit (INDEPENDENT_AMBULATORY_CARE_PROVIDER_SITE_OTHER): Payer: Medicaid Other | Admitting: Obstetrics and Gynecology

## 2013-04-13 ENCOUNTER — Encounter: Payer: Self-pay | Admitting: Obstetrics and Gynecology

## 2013-04-13 VITALS — BP 121/84 | Temp 97.8°F | Wt 196.8 lb

## 2013-04-13 DIAGNOSIS — O0990 Supervision of high risk pregnancy, unspecified, unspecified trimester: Secondary | ICD-10-CM

## 2013-04-13 DIAGNOSIS — O09219 Supervision of pregnancy with history of pre-term labor, unspecified trimester: Secondary | ICD-10-CM

## 2013-04-13 DIAGNOSIS — O09213 Supervision of pregnancy with history of pre-term labor, third trimester: Secondary | ICD-10-CM

## 2013-04-13 LAB — POCT URINALYSIS DIP (DEVICE)
Glucose, UA: NEGATIVE mg/dL
Hgb urine dipstick: NEGATIVE
Ketones, ur: NEGATIVE mg/dL
Nitrite: NEGATIVE
Specific Gravity, Urine: 1.015 (ref 1.005–1.030)
Urobilinogen, UA: 0.2 mg/dL (ref 0.0–1.0)

## 2013-04-13 NOTE — Progress Notes (Signed)
P=88,Used Interpreter

## 2013-04-13 NOTE — Progress Notes (Signed)
Patient is doing well without complaints. FM/PTL precautions reviewed. Patient contemplating Nexplanon for contraception. Continue weekly 17-P Results of 12/4 ultrasound reviewed and explained to the patient

## 2013-04-20 ENCOUNTER — Ambulatory Visit (INDEPENDENT_AMBULATORY_CARE_PROVIDER_SITE_OTHER): Payer: Medicaid Other | Admitting: General Practice

## 2013-04-20 VITALS — BP 121/81 | HR 82 | Temp 96.8°F | Ht 65.0 in | Wt 195.8 lb

## 2013-04-20 DIAGNOSIS — O09219 Supervision of pregnancy with history of pre-term labor, unspecified trimester: Secondary | ICD-10-CM

## 2013-04-20 DIAGNOSIS — O09213 Supervision of pregnancy with history of pre-term labor, third trimester: Secondary | ICD-10-CM

## 2013-04-27 ENCOUNTER — Encounter: Payer: Self-pay | Admitting: Obstetrics and Gynecology

## 2013-04-27 ENCOUNTER — Ambulatory Visit (INDEPENDENT_AMBULATORY_CARE_PROVIDER_SITE_OTHER): Payer: Medicaid Other | Admitting: Obstetrics and Gynecology

## 2013-04-27 VITALS — BP 130/83 | Temp 98.4°F | Wt 198.1 lb

## 2013-04-27 DIAGNOSIS — O09213 Supervision of pregnancy with history of pre-term labor, third trimester: Secondary | ICD-10-CM

## 2013-04-27 DIAGNOSIS — O0990 Supervision of high risk pregnancy, unspecified, unspecified trimester: Secondary | ICD-10-CM

## 2013-04-27 DIAGNOSIS — O09219 Supervision of pregnancy with history of pre-term labor, unspecified trimester: Secondary | ICD-10-CM

## 2013-04-27 LAB — POCT URINALYSIS DIP (DEVICE)
Bilirubin Urine: NEGATIVE
Glucose, UA: NEGATIVE mg/dL
Nitrite: NEGATIVE
Specific Gravity, Urine: 1.015 (ref 1.005–1.030)
pH: 7.5 (ref 5.0–8.0)

## 2013-04-27 NOTE — Progress Notes (Signed)
Pulse- 80 Patient reports a tightening sensation like contractions but isn't painful

## 2013-04-27 NOTE — Progress Notes (Signed)
Patient doing well without complaints. FM/PTL precautions reviewed. Cultures next visit. Continue weekly 17-p

## 2013-05-04 ENCOUNTER — Ambulatory Visit (INDEPENDENT_AMBULATORY_CARE_PROVIDER_SITE_OTHER): Payer: Medicaid Other | Admitting: *Deleted

## 2013-05-04 VITALS — BP 120/77 | HR 93 | Temp 97.3°F | Wt 198.9 lb

## 2013-05-04 DIAGNOSIS — O09219 Supervision of pregnancy with history of pre-term labor, unspecified trimester: Secondary | ICD-10-CM

## 2013-05-04 DIAGNOSIS — O09213 Supervision of pregnancy with history of pre-term labor, third trimester: Secondary | ICD-10-CM

## 2013-05-04 MED ORDER — HYDROXYPROGESTERONE CAPROATE 250 MG/ML IM OIL
250.0000 mg | TOPICAL_OIL | Freq: Once | INTRAMUSCULAR | Status: AC
  Administered 2013-05-04: 250 mg via INTRAMUSCULAR

## 2013-05-11 ENCOUNTER — Ambulatory Visit (INDEPENDENT_AMBULATORY_CARE_PROVIDER_SITE_OTHER): Payer: Medicaid Other | Admitting: Family Medicine

## 2013-05-11 ENCOUNTER — Encounter: Payer: Self-pay | Admitting: Family Medicine

## 2013-05-11 VITALS — BP 119/82 | Temp 97.0°F | Wt 197.5 lb

## 2013-05-11 DIAGNOSIS — O09219 Supervision of pregnancy with history of pre-term labor, unspecified trimester: Secondary | ICD-10-CM

## 2013-05-11 DIAGNOSIS — O0990 Supervision of high risk pregnancy, unspecified, unspecified trimester: Secondary | ICD-10-CM

## 2013-05-11 DIAGNOSIS — O09213 Supervision of pregnancy with history of pre-term labor, third trimester: Secondary | ICD-10-CM

## 2013-05-11 LAB — POCT URINALYSIS DIP (DEVICE)
Bilirubin Urine: NEGATIVE
Glucose, UA: NEGATIVE mg/dL
Ketones, ur: NEGATIVE mg/dL
Nitrite: NEGATIVE
PROTEIN: NEGATIVE mg/dL
Specific Gravity, Urine: 1.015 (ref 1.005–1.030)
UROBILINOGEN UA: 0.2 mg/dL (ref 0.0–1.0)
pH: 7 (ref 5.0–8.0)

## 2013-05-11 LAB — OB RESULTS CONSOLE GC/CHLAMYDIA
CHLAMYDIA, DNA PROBE: NEGATIVE
Gonorrhea: NEGATIVE

## 2013-05-11 LAB — OB RESULTS CONSOLE GBS: GBS: NEGATIVE

## 2013-05-11 MED ORDER — HYDROXYPROGESTERONE CAPROATE 250 MG/ML IM OIL
250.0000 mg | TOPICAL_OIL | Freq: Once | INTRAMUSCULAR | Status: AC
  Administered 2013-05-11: 250 mg via INTRAMUSCULAR

## 2013-05-11 NOTE — Progress Notes (Signed)
Pulse: 89

## 2013-05-11 NOTE — Patient Instructions (Signed)
Third Trimester of Pregnancy The third trimester is from week 29 through week 42, months 7 through 9. The third trimester is a time when the fetus is growing rapidly. At the end of the ninth month, the fetus is about 20 inches in length and weighs 6 10 pounds.  BODY CHANGES Your body goes through many changes during pregnancy. The changes vary from woman to woman.   Your weight will continue to increase. You can expect to gain 25 35 pounds (11 16 kg) by the end of the pregnancy.  You may begin to get stretch marks on your hips, abdomen, and breasts.  You may urinate more often because the fetus is moving lower into your pelvis and pressing on your bladder.  You may develop or continue to have heartburn as a result of your pregnancy.  You may develop constipation because certain hormones are causing the muscles that push waste through your intestines to slow down.  You may develop hemorrhoids or swollen, bulging veins (varicose veins).  You may have pelvic pain because of the weight gain and pregnancy hormones relaxing your joints between the bones in your pelvis. Back aches may result from over exertion of the muscles supporting your posture.  Your breasts will continue to grow and be tender. A yellow discharge may leak from your breasts called colostrum.  Your belly button may stick out.  You may feel short of breath because of your expanding uterus.  You may notice the fetus "dropping," or moving lower in your abdomen.  You may have a bloody mucus discharge. This usually occurs a few days to a week before labor begins.  Your cervix becomes thin and soft (effaced) near your due date. WHAT TO EXPECT AT YOUR PRENATAL EXAMS  You will have prenatal exams every 2 weeks until week 36. Then, you will have weekly prenatal exams. During a routine prenatal visit:  You will be weighed to make sure you and the fetus are growing normally.  Your blood pressure is taken.  Your abdomen will  be measured to track your baby's growth.  The fetal heartbeat will be listened to.  Any test results from the previous visit will be discussed.  You may have a cervical check near your due date to see if you have effaced. At around 36 weeks, your caregiver will check your cervix. At the same time, your caregiver will also perform a test on the secretions of the vaginal tissue. This test is to determine if a type of bacteria, Group B streptococcus, is present. Your caregiver will explain this further. Your caregiver may ask you:  What your birth plan is.  How you are feeling.  If you are feeling the baby move.  If you have had any abnormal symptoms, such as leaking fluid, bleeding, severe headaches, or abdominal cramping.  If you have any questions. Other tests or screenings that may be performed during your third trimester include:  Blood tests that check for low iron levels (anemia).  Fetal testing to check the health, activity level, and growth of the fetus. Testing is done if you have certain medical conditions or if there are problems during the pregnancy. FALSE LABOR You may feel small, irregular contractions that eventually go away. These are called Braxton Hicks contractions, or false labor. Contractions may last for hours, days, or even weeks before true labor sets in. If contractions come at regular intervals, intensify, or become painful, it is best to be seen by your caregiver.    SIGNS OF LABOR   Menstrual-like cramps.  Contractions that are 5 minutes apart or less.  Contractions that start on the top of the uterus and spread down to the lower abdomen and back.  A sense of increased pelvic pressure or back pain.  A watery or bloody mucus discharge that comes from the vagina. If you have any of these signs before the 37th week of pregnancy, call your caregiver right away. You need to go to the hospital to get checked immediately. HOME CARE INSTRUCTIONS   Avoid all  smoking, herbs, alcohol, and unprescribed drugs. These chemicals affect the formation and growth of the baby.  Follow your caregiver's instructions regarding medicine use. There are medicines that are either safe or unsafe to take during pregnancy.  Exercise only as directed by your caregiver. Experiencing uterine cramps is a good sign to stop exercising.  Continue to eat regular, healthy meals.  Wear a good support bra for breast tenderness.  Do not use hot tubs, steam rooms, or saunas.  Wear your seat belt at all times when driving.  Avoid raw meat, uncooked cheese, cat litter boxes, and soil used by cats. These carry germs that can cause birth defects in the baby.  Take your prenatal vitamins.  Try taking a stool softener (if your caregiver approves) if you develop constipation. Eat more high-fiber foods, such as fresh vegetables or fruit and whole grains. Drink plenty of fluids to keep your urine clear or pale yellow.  Take warm sitz baths to soothe any pain or discomfort caused by hemorrhoids. Use hemorrhoid cream if your caregiver approves.  If you develop varicose veins, wear support hose. Elevate your feet for 15 minutes, 3 4 times a day. Limit salt in your diet.  Avoid heavy lifting, wear low heal shoes, and practice good posture.  Rest a lot with your legs elevated if you have leg cramps or low back pain.  Visit your dentist if you have not gone during your pregnancy. Use a soft toothbrush to brush your teeth and be gentle when you floss.  A sexual relationship may be continued unless your caregiver directs you otherwise.  Do not travel far distances unless it is absolutely necessary and only with the approval of your caregiver.  Take prenatal classes to understand, practice, and ask questions about the labor and delivery.  Make a trial run to the hospital.  Pack your hospital bag.  Prepare the baby's nursery.  Continue to go to all your prenatal visits as directed  by your caregiver. SEEK MEDICAL CARE IF:  You are unsure if you are in labor or if your water has broken.  You have dizziness.  You have mild pelvic cramps, pelvic pressure, or nagging pain in your abdominal area.  You have persistent nausea, vomiting, or diarrhea.  You have a bad smelling vaginal discharge.  You have pain with urination. SEEK IMMEDIATE MEDICAL CARE IF:   You have a fever.  You are leaking fluid from your vagina.  You have spotting or bleeding from your vagina.  You have severe abdominal cramping or pain.  You have rapid weight loss or gain.  You have shortness of breath with chest pain.  You notice sudden or extreme swelling of your face, hands, ankles, feet, or legs.  You have not felt your baby move in over an hour.  You have severe headaches that do not go away with medicine.  You have vision changes. Document Released: 04/17/2001 Document Revised: 12/24/2012 Document Reviewed:   06/24/2012 [employer] Patient Information 2014 [employer].  Breastfeeding Deciding to breastfeed is one of the best choices you can make for you and your baby. A change in hormones during pregnancy causes your breast tissue to grow and increases the number and size of your milk ducts. These hormones also allow proteins, sugars, and fats from your blood supply to make breast milk in your milk-producing glands. Hormones prevent breast milk from being released before your baby is born as well as prompt milk flow after birth. Once breastfeeding has begun, thoughts of your baby, as well as his or her sucking or crying, can stimulate the release of milk from your milk-producing glands.  BENEFITS OF BREASTFEEDING For Your Baby  Your first milk (colostrum) helps your baby's digestive system function better.   There are antibodies in your milk that help your baby fight off infections.   Your baby has a lower incidence of asthma, allergies, and sudden infant death syndrome.    The nutrients in breast milk are better for your baby than infant formulas and are designed uniquely for your baby's needs.   Breast milk improves your baby's brain development.   Your baby is less likely to develop other conditions, such as childhood obesity, asthma, or type 2 diabetes mellitus.  For You   Breastfeeding helps to create a very special bond between you and your baby.   Breastfeeding is convenient. Breast milk is always available at the correct temperature and costs nothing.   Breastfeeding helps to burn calories and helps you lose the weight gained during pregnancy.   Breastfeeding makes your uterus contract to its prepregnancy size faster and slows bleeding (lochia) after you give birth.   Breastfeeding helps to lower your risk of developing type 2 diabetes mellitus, osteoporosis, and breast or ovarian cancer later in life. SIGNS THAT YOUR BABY IS HUNGRY Early Signs of Hunger  Increased alertness or activity.  Stretching.  Movement of the head from side to side.  Movement of the head and opening of the mouth when the corner of the mouth or cheek is stroked (rooting).  Increased sucking sounds, smacking lips, cooing, sighing, or squeaking.  Hand-to-mouth movements.  Increased sucking of fingers or hands. Late Signs of Hunger  Fussing.  Intermittent crying. Extreme Signs of Hunger Signs of extreme hunger will require calming and consoling before your baby will be able to breastfeed successfully. Do not wait for the following signs of extreme hunger to occur before you initiate breastfeeding:   Restlessness.  A loud, strong cry.   Screaming. BREASTFEEDING BASICS Breastfeeding Initiation  Find a comfortable place to sit or lie down, with your neck and back well supported.  Place a pillow or rolled up blanket under your baby to bring him or her to the level of your breast (if you are seated). Nursing pillows are specially designed to help  support your arms and your baby while you breastfeed.  Make sure that your baby's abdomen is facing your abdomen.   Gently massage your breast. With your fingertips, massage from your chest wall toward your nipple in a circular motion. This encourages milk flow. You may need to continue this action during the feeding if your milk flows slowly.  Support your breast with 4 fingers underneath and your thumb above your nipple. Make sure your fingers are well away from your nipple and your baby's mouth.   Stroke your baby's lips gently with your finger or nipple.   When your baby's mouth is   open wide enough, quickly bring your baby to your breast, placing your entire nipple and as much of the colored area around your nipple (areola) as possible into your baby's mouth.   More areola should be visible above your baby's upper lip than below the lower lip.   Your baby's tongue should be between his or her lower gum and your breast.   Ensure that your baby's mouth is correctly positioned around your nipple (latched). Your baby's lips should create a seal on your breast and be turned out (everted).  It is common for your baby to suck about 2 3 minutes in order to start the flow of breast milk. Latching Teaching your baby how to latch on to your breast properly is very important. An improper latch can cause nipple pain and decreased milk supply for you and poor weight gain in your baby. Also, if your baby is not latched onto your nipple properly, he or she may swallow some air during feeding. This can make your baby fussy. Burping your baby when you switch breasts during the feeding can help to get rid of the air. However, teaching your baby to latch on properly is still the best way to prevent fussiness from swallowing air while breastfeeding. Signs that your baby has successfully latched on to your nipple:    Silent tugging or silent sucking, without causing you pain.   Swallowing heard  between every 3 4 sucks.    Muscle movement above and in front of his or her ears while sucking.  Signs that your baby has not successfully latched on to nipple:   Sucking sounds or smacking sounds from your baby while breastfeeding.  Nipple pain. If you think your baby has not latched on correctly, slip your finger into the corner of your baby's mouth to break the suction and place it between your baby's gums. Attempt breastfeeding initiation again. Signs of Successful Breastfeeding Signs from your baby:   A gradual decrease in the number of sucks or complete cessation of sucking.   Falling asleep.   Relaxation of his or her body.   Retention of a small amount of milk in his or her mouth.   Letting go of your breast by himself or herself. Signs from you:  Breasts that have increased in firmness, weight, and size 1 3 hours after feeding.   Breasts that are softer immediately after breastfeeding.  Increased milk volume, as well as a change in milk consistency and color by the 5th day of breastfeeding.   Nipples that are not sore, cracked, or bleeding. Signs That Your Baby is Getting Enough Milk  Wetting at least 3 diapers in a 24-hour period. The urine should be clear and pale yellow by age 5 days.  At least 3 stools in a 24-hour period by age 5 days. The stool should be soft and yellow.  At least 3 stools in a 24-hour period by age 7 days. The stool should be seedy and yellow.  No loss of weight greater than 10% of birth weight during the first 3 days of age.  Average weight gain of 4 7 ounces (120 210 mL) per week after age 4 days.  Consistent daily weight gain by age 5 days, without weight loss after the age of 2 weeks. After a feeding, your baby may spit up a small amount. This is common. BREASTFEEDING FREQUENCY AND DURATION Frequent feeding will help you make more milk and can prevent sore nipples and breast engorgement.   Breastfeed when you feel the need to  reduce the fullness of your breasts or when your baby shows signs of hunger. This is called "breastfeeding on demand." Avoid introducing a pacifier to your baby while you are working to establish breastfeeding (the first 4 6 weeks after your baby is born). After this time you may choose to use a pacifier. Research has shown that pacifier use during the first year of a baby's life decreases the risk of sudden infant death syndrome (SIDS). Allow your baby to feed on each breast as long as he or she wants. Breastfeed until your baby is finished feeding. When your baby unlatches or falls asleep while feeding from the first breast, offer the second breast. Because newborns are often sleepy in the first few weeks of life, you may need to awaken your baby to get him or her to feed. Breastfeeding times will vary from baby to baby. However, the following rules can serve as a guide to help you ensure that your baby is properly fed:  Newborns (babies 4 weeks of age or younger) may breastfeed every 1 3 hours.  Newborns should not go longer than 3 hours during the day or 5 hours during the night without breastfeeding.  You should breastfeed your baby a minimum of 8 times in a 24-hour period until you begin to introduce solid foods to your baby at around 6 months of age. BREAST MILK PUMPING Pumping and storing breast milk allows you to ensure that your baby is exclusively fed your breast milk, even at times when you are unable to breastfeed. This is especially important if you are going back to work while you are still breastfeeding or when you are not able to be present during feedings. Your lactation consultant can give you guidelines on how long it is safe to store breast milk.  A breast pump is a machine that allows you to pump milk from your breast into a sterile bottle. The pumped breast milk can then be stored in a refrigerator or freezer. Some breast pumps are operated by hand, while others use electricity. Ask  your lactation consultant which type will work best for you. Breast pumps can be purchased, but some hospitals and breastfeeding support groups lease breast pumps on a monthly basis. A lactation consultant can teach you how to hand express breast milk, if you prefer not to use a pump.  CARING FOR YOUR BREASTS WHILE YOU BREASTFEED Nipples can become dry, cracked, and sore while breastfeeding. The following recommendations can help keep your breasts moisturized and healthy:  Avoid using soap on your nipples.   Wear a supportive bra. Although not required, special nursing bras and tank tops are designed to allow access to your breasts for breastfeeding without taking off your entire bra or top. Avoid wearing underwire style bras or extremely tight bras.  Air dry your nipples for 3 4minutes after each feeding.   Use only cotton bra pads to absorb leaked breast milk. Leaking of breast milk between feedings is normal.   Use lanolin on your nipples after breastfeeding. Lanolin helps to maintain your skin's normal moisture barrier. If you use pure lanolin you do not need to wash it off before feeding your baby again. Pure lanolin is not toxic to your baby. You may also hand express a few drops of breast milk and gently massage that milk into your nipples and allow the milk to air dry. In the first few weeks after giving birth, some women   experience extremely full breasts (engorgement). Engorgement can make your breasts feel heavy, warm, and tender to the touch. Engorgement peaks within 3 5 days after you give birth. The following recommendations can help ease engorgement:  Completely empty your breasts while breastfeeding or pumping. You may want to start by applying warm, moist heat (in the shower or with warm water-soaked hand towels) just before feeding or pumping. This increases circulation and helps the milk flow. If your baby does not completely empty your breasts while breastfeeding, pump any extra  milk after he or she is finished.  Wear a snug bra (nursing or regular) or tank top for 1 2 days to signal your body to slightly decrease milk production.  Apply ice packs to your breasts, unless this is too uncomfortable for you.  Make sure that your baby is latched on and positioned properly while breastfeeding. If engorgement persists after 48 hours of following these recommendations, contact your health care provider or a lactation consultant. OVERALL HEALTH CARE RECOMMENDATIONS WHILE BREASTFEEDING  Eat healthy foods. Alternate between meals and snacks, eating 3 of each per day. Because what you eat affects your breast milk, some of the foods may make your baby more irritable than usual. Avoid eating these foods if you are sure that they are negatively affecting your baby.  Drink milk, fruit juice, and water to satisfy your thirst (about 10 glasses a day).   Rest often, relax, and continue to take your prenatal vitamins to prevent fatigue, stress, and anemia.  Continue breast self-awareness checks.  Avoid chewing and smoking tobacco.  Avoid alcohol and drug use. Some medicines that may be harmful to your baby can pass through breast milk. It is important to ask your health care provider before taking any medicine, including all over-the-counter and prescription medicine as well as vitamin and herbal supplements. It is possible to become pregnant while breastfeeding. If birth control is desired, ask your health care provider about options that will be safe for your baby. SEEK MEDICAL CARE IF:   You feel like you want to stop breastfeeding or have become frustrated with breastfeeding.  You have painful breasts or nipples.  Your nipples are cracked or bleeding.  Your breasts are red, tender, or warm.  You have a swollen area on either breast.  You have a fever or chills.  You have nausea or vomiting.  You have drainage other than breast milk from your nipples.  Your breasts  do not become full before feedings by the 5th day after you give birth.  You feel sad and depressed.  Your baby is too sleepy to eat well.  Your baby is having trouble sleeping.   Your baby is wetting less than 3 diapers in a 24-hour period.  Your baby has less than 3 stools in a 24-hour period.  Your baby's skin or the white part of his or her eyes becomes yellow.   Your baby is not gaining weight by 5 days of age. SEEK IMMEDIATE MEDICAL CARE IF:   Your baby is overly tired (lethargic) and does not want to wake up and feed.  Your baby develops an unexplained fever. Document Released: 04/23/2005 Document Revised: 12/24/2012 Document Reviewed: 10/15/2012 [employer] Patient Information 2014 [employer].  

## 2013-05-11 NOTE — Progress Notes (Signed)
Last weekly 17 P today Cultures today.

## 2013-05-12 LAB — GC/CHLAMYDIA PROBE AMP
CT Probe RNA: NEGATIVE
GC Probe RNA: NEGATIVE

## 2013-05-13 LAB — CULTURE, BETA STREP (GROUP B ONLY)

## 2013-05-19 ENCOUNTER — Ambulatory Visit (INDEPENDENT_AMBULATORY_CARE_PROVIDER_SITE_OTHER): Payer: Medicaid Other | Admitting: Obstetrics and Gynecology

## 2013-05-19 ENCOUNTER — Encounter: Payer: Self-pay | Admitting: Obstetrics and Gynecology

## 2013-05-19 VITALS — BP 127/82 | Temp 97.9°F | Wt 200.0 lb

## 2013-05-19 DIAGNOSIS — O09219 Supervision of pregnancy with history of pre-term labor, unspecified trimester: Secondary | ICD-10-CM

## 2013-05-19 DIAGNOSIS — O0990 Supervision of high risk pregnancy, unspecified, unspecified trimester: Secondary | ICD-10-CM

## 2013-05-19 DIAGNOSIS — R8271 Bacteriuria: Secondary | ICD-10-CM

## 2013-05-19 DIAGNOSIS — B951 Streptococcus, group B, as the cause of diseases classified elsewhere: Secondary | ICD-10-CM

## 2013-05-19 DIAGNOSIS — N390 Urinary tract infection, site not specified: Secondary | ICD-10-CM

## 2013-05-19 LAB — POCT URINALYSIS DIP (DEVICE)
Bilirubin Urine: NEGATIVE
Glucose, UA: NEGATIVE mg/dL
Ketones, ur: NEGATIVE mg/dL
NITRITE: NEGATIVE
PH: 6.5 (ref 5.0–8.0)
PROTEIN: NEGATIVE mg/dL
Specific Gravity, Urine: 1.01 (ref 1.005–1.030)
Urobilinogen, UA: 0.2 mg/dL (ref 0.0–1.0)

## 2013-05-19 NOTE — Progress Notes (Signed)
Patient is doing well without complaints. FM/labor precautions reviewed 

## 2013-05-19 NOTE — Progress Notes (Signed)
Pulse: 88

## 2013-05-26 ENCOUNTER — Encounter: Payer: Self-pay | Admitting: Obstetrics and Gynecology

## 2013-05-26 ENCOUNTER — Ambulatory Visit (INDEPENDENT_AMBULATORY_CARE_PROVIDER_SITE_OTHER): Payer: Medicaid Other | Admitting: Obstetrics and Gynecology

## 2013-05-26 VITALS — BP 120/84 | Temp 96.4°F | Wt 205.2 lb

## 2013-05-26 DIAGNOSIS — O09219 Supervision of pregnancy with history of pre-term labor, unspecified trimester: Secondary | ICD-10-CM

## 2013-05-26 DIAGNOSIS — O0990 Supervision of high risk pregnancy, unspecified, unspecified trimester: Secondary | ICD-10-CM

## 2013-05-26 DIAGNOSIS — B951 Streptococcus, group B, as the cause of diseases classified elsewhere: Secondary | ICD-10-CM

## 2013-05-26 DIAGNOSIS — R8271 Bacteriuria: Secondary | ICD-10-CM

## 2013-05-26 DIAGNOSIS — N390 Urinary tract infection, site not specified: Secondary | ICD-10-CM

## 2013-05-26 LAB — POCT URINALYSIS DIP (DEVICE)
Bilirubin Urine: NEGATIVE
Glucose, UA: NEGATIVE mg/dL
Ketones, ur: NEGATIVE mg/dL
Nitrite: NEGATIVE
PH: 6 (ref 5.0–8.0)
PROTEIN: NEGATIVE mg/dL
Specific Gravity, Urine: 1.02 (ref 1.005–1.030)
UROBILINOGEN UA: 0.2 mg/dL (ref 0.0–1.0)

## 2013-05-26 LAB — GLUCOSE, CAPILLARY: Glucose-Capillary: 170 mg/dL — ABNORMAL HIGH (ref 70–99)

## 2013-05-26 NOTE — Progress Notes (Signed)
Pulse- 94 Patients states she had rice this morning at 930

## 2013-05-26 NOTE — Progress Notes (Signed)
Patient is doing well without complaints. Random cbg 170 at 11 am. Patient consumed rice at 9:30 am. Reviewed diet with patient. FM/labor precautions reviewed

## 2013-05-28 ENCOUNTER — Telehealth: Payer: Self-pay

## 2013-05-28 MED ORDER — CEPHALEXIN 500 MG PO CAPS: 500.0000 mg | ORAL_CAPSULE | Freq: Four times a day (QID) | ORAL | Status: DC

## 2013-05-28 NOTE — Telephone Encounter (Signed)
[facility] Left message with interpreter stating we are calling with results and of information about a prescription that has been sent to your [facility], call clinic.

## 2013-05-28 NOTE — Addendum Note (Signed)
Addended by: Catalina AntiguaONSTANT, Breyonna Nault on: 05/28/2013 12:00 PM   Modules accepted: Orders

## 2013-05-28 NOTE — Telephone Encounter (Signed)
Pt. To be informed of UTI-- keflex e-[facility] [address]

## 2013-05-31 LAB — CULTURE, OB URINE: Colony Count: >=100000

## 2013-06-02 ENCOUNTER — Ambulatory Visit (INDEPENDENT_AMBULATORY_CARE_PROVIDER_SITE_OTHER): Payer: Medicaid Other | Admitting: Obstetrics & Gynecology

## 2013-06-02 ENCOUNTER — Encounter: Payer: Self-pay | Admitting: Obstetrics & Gynecology

## 2013-06-02 VITALS — BP 133/84 | Wt 205.7 lb

## 2013-06-02 DIAGNOSIS — R8271 Bacteriuria: Secondary | ICD-10-CM

## 2013-06-02 DIAGNOSIS — N390 Urinary tract infection, site not specified: Secondary | ICD-10-CM

## 2013-06-02 DIAGNOSIS — O2390 Unspecified genitourinary tract infection in pregnancy, unspecified trimester: Secondary | ICD-10-CM

## 2013-06-02 DIAGNOSIS — O0990 Supervision of high risk pregnancy, unspecified, unspecified trimester: Secondary | ICD-10-CM

## 2013-06-02 DIAGNOSIS — B951 Streptococcus, group B, as the cause of diseases classified elsewhere: Secondary | ICD-10-CM

## 2013-06-02 LAB — POCT URINALYSIS DIP (DEVICE)
BILIRUBIN URINE: NEGATIVE
Glucose, UA: NEGATIVE mg/dL
Hgb urine dipstick: NEGATIVE
Ketones, ur: NEGATIVE mg/dL
Nitrite: NEGATIVE
PH: 7 (ref 5.0–8.0)
PROTEIN: NEGATIVE mg/dL
SPECIFIC GRAVITY, URINE: 1.01 (ref 1.005–1.030)
Urobilinogen, UA: 0.2 mg/dL (ref 0.0–1.0)

## 2013-06-02 NOTE — Patient Instructions (Signed)
Third Trimester of Pregnancy  The third trimester is from week 29 through week 42, months 7 through 9. The third trimester is a time when the fetus is growing rapidly. At the end of the ninth month, the fetus is about 20 inches in length and weighs 6 10 pounds.   BODY CHANGES  Your body goes through many changes during pregnancy. The changes vary from woman to woman.    Your weight will continue to increase. You can expect to gain 25 35 pounds (11 16 kg) by the end of the pregnancy.   You may begin to get stretch marks on your hips, abdomen, and breasts.   You may urinate more often because the fetus is moving lower into your pelvis and pressing on your bladder.   You may develop or continue to have heartburn as a result of your pregnancy.   You may develop constipation because certain hormones are causing the muscles that push waste through your intestines to slow down.   You may develop hemorrhoids or swollen, bulging veins (varicose veins).   You may have pelvic pain because of the weight gain and pregnancy hormones relaxing your joints between the bones in your pelvis. Back aches may result from over exertion of the muscles supporting your posture.   Your breasts will continue to grow and be tender. A yellow discharge may leak from your breasts called colostrum.   Your belly button may stick out.   You may feel short of breath because of your expanding uterus.   You may notice the fetus "dropping," or moving lower in your abdomen.   You may have a bloody mucus discharge. This usually occurs a few days to a week before labor begins.   Your cervix becomes thin and soft (effaced) near your due date.  WHAT TO EXPECT AT YOUR PRENATAL EXAMS   You will have prenatal exams every 2 weeks until week 36. Then, you will have weekly prenatal exams. During a routine prenatal visit:   You will be weighed to make sure you and the fetus are growing normally.   Your blood pressure is taken.   Your abdomen will be  measured to track your baby's growth.   The fetal heartbeat will be listened to.   Any test results from the previous visit will be discussed.   You may have a cervical check near your due date to see if you have effaced.  At around 36 weeks, your caregiver will check your cervix. At the same time, your caregiver will also perform a test on the secretions of the vaginal tissue. This test is to determine if a type of bacteria, Group B streptococcus, is present. Your caregiver will explain this further.  Your caregiver may ask you:   What your birth plan is.   How you are feeling.   If you are feeling the baby move.   If you have had any abnormal symptoms, such as leaking fluid, bleeding, severe headaches, or abdominal cramping.   If you have any questions.  Other tests or screenings that may be performed during your third trimester include:   Blood tests that check for low iron levels (anemia).   Fetal testing to check the health, activity level, and growth of the fetus. Testing is done if you have certain medical conditions or if there are problems during the pregnancy.  FALSE LABOR  You may feel small, irregular contractions that eventually go away. These are called Braxton Hicks contractions, or   false labor. Contractions may last for hours, days, or even weeks before true labor sets in. If contractions come at regular intervals, intensify, or become painful, it is best to be seen by your caregiver.   SIGNS OF LABOR    Menstrual-like cramps.   Contractions that are 5 minutes apart or less.   Contractions that start on the top of the uterus and spread down to the lower abdomen and back.   A sense of increased pelvic pressure or back pain.   A watery or bloody mucus discharge that comes from the vagina.  If you have any of these signs before the 37th week of pregnancy, call your caregiver right away. You need to go to the hospital to get checked immediately.  HOME CARE INSTRUCTIONS    Avoid all  smoking, herbs, alcohol, and unprescribed drugs. These chemicals affect the formation and growth of the baby.   Follow your caregiver's instructions regarding medicine use. There are medicines that are either safe or unsafe to take during pregnancy.   Exercise only as directed by your caregiver. Experiencing uterine cramps is a good sign to stop exercising.   Continue to eat regular, healthy meals.   Wear a good support bra for breast tenderness.   Do not use hot tubs, steam rooms, or saunas.   Wear your seat belt at all times when driving.   Avoid raw meat, uncooked cheese, cat litter boxes, and soil used by cats. These carry germs that can cause birth defects in the baby.   Take your prenatal vitamins.   Try taking a stool softener (if your caregiver approves) if you develop constipation. Eat more high-fiber foods, such as fresh vegetables or fruit and whole grains. Drink plenty of fluids to keep your urine clear or pale yellow.   Take warm sitz baths to soothe any pain or discomfort caused by hemorrhoids. Use hemorrhoid cream if your caregiver approves.   If you develop varicose veins, wear support hose. Elevate your feet for 15 minutes, 3 4 times a day. Limit salt in your diet.   Avoid heavy lifting, wear low heal shoes, and practice good posture.   Rest a lot with your legs elevated if you have leg cramps or low back pain.   Visit your dentist if you have not gone during your pregnancy. Use a soft toothbrush to brush your teeth and be gentle when you floss.   A sexual relationship may be continued unless your caregiver directs you otherwise.   Do not travel far distances unless it is absolutely necessary and only with the approval of your caregiver.   Take prenatal classes to understand, practice, and ask questions about the labor and delivery.   Make a trial run to the hospital.   Pack your hospital bag.   Prepare the baby's nursery.   Continue to go to all your prenatal visits as directed  by your caregiver.  SEEK MEDICAL CARE IF:   You are unsure if you are in labor or if your water has broken.   You have dizziness.   You have mild pelvic cramps, pelvic pressure, or nagging pain in your abdominal area.   You have persistent nausea, vomiting, or diarrhea.   You have a bad smelling vaginal discharge.   You have pain with urination.  SEEK IMMEDIATE MEDICAL CARE IF:    You have a fever.   You are leaking fluid from your vagina.   You have spotting or bleeding from your vagina.     You have severe abdominal cramping or pain.   You have rapid weight loss or gain.   You have shortness of breath with chest pain.   You notice sudden or extreme swelling of your face, hands, ankles, feet, or legs.   You have not felt your baby move in over an hour.   You have severe headaches that do not go away with medicine.   You have vision changes.  Document Released: 04/17/2001 Document Revised: 12/24/2012 Document Reviewed: 06/24/2012  [employer] Patient Information 2014 [employer].

## 2013-06-02 NOTE — Telephone Encounter (Signed)
Pt has clinic appt today and will be informed of UTI and Rx for Keflex.

## 2013-06-02 NOTE — Progress Notes (Signed)
Pulse 91 Patient informed that she has UTI will pick up med at pharmacy.

## 2013-06-02 NOTE — Progress Notes (Signed)
+  FM , No ctx, No LOF or VB Had UTI from last visit did not get meds filled. Notified of need for tx Desires Nexplanon PP

## 2013-06-07 ENCOUNTER — Inpatient Hospital Stay (HOSPITAL_COMMUNITY)
Admission: AD | Admit: 2013-06-07 | Discharge: 2013-06-10 | DRG: 765 | Disposition: A | Discharge status: Home / Self Care | Payer: Medicaid Other | Source: Ambulatory Visit | Attending: Obstetrics & Gynecology | Admitting: Obstetrics & Gynecology

## 2013-06-07 ENCOUNTER — Encounter (HOSPITAL_COMMUNITY): Payer: Self-pay | Admitting: Family Medicine

## 2013-06-07 ENCOUNTER — Inpatient Hospital Stay (HOSPITAL_COMMUNITY)
Admission: AD | Admit: 2013-06-07 | Discharge: 2013-06-07 | Disposition: A | Discharge status: Home / Self Care | Payer: Medicaid Other | Source: Ambulatory Visit | Attending: Family Medicine | Admitting: Family Medicine

## 2013-06-07 ENCOUNTER — Encounter (HOSPITAL_COMMUNITY): Payer: Self-pay | Admitting: *Deleted

## 2013-06-07 DIAGNOSIS — O2390 Unspecified genitourinary tract infection in pregnancy, unspecified trimester: Secondary | ICD-10-CM | POA: Diagnosis present

## 2013-06-07 DIAGNOSIS — O9989 Other specified diseases and conditions complicating pregnancy, childbirth and the puerperium: Secondary | ICD-10-CM

## 2013-06-07 DIAGNOSIS — O324XX0 Maternal care for high head at term, not applicable or unspecified: Secondary | ICD-10-CM | POA: Diagnosis present

## 2013-06-07 DIAGNOSIS — O0990 Supervision of high risk pregnancy, unspecified, unspecified trimester: Secondary | ICD-10-CM

## 2013-06-07 DIAGNOSIS — B373 Candidiasis of vulva and vagina: Secondary | ICD-10-CM | POA: Diagnosis present

## 2013-06-07 DIAGNOSIS — O1490 Unspecified pre-eclampsia, unspecified trimester: Secondary | ICD-10-CM

## 2013-06-07 DIAGNOSIS — O479 False labor, unspecified: Secondary | ICD-10-CM | POA: Insufficient documentation

## 2013-06-07 DIAGNOSIS — IMO0001 Reserved for inherently not codable concepts without codable children: Secondary | ICD-10-CM

## 2013-06-07 DIAGNOSIS — N390 Urinary tract infection, site not specified: Secondary | ICD-10-CM | POA: Diagnosis present

## 2013-06-07 DIAGNOSIS — O09219 Supervision of pregnancy with history of pre-term labor, unspecified trimester: Secondary | ICD-10-CM

## 2013-06-07 DIAGNOSIS — IMO0002 Reserved for concepts with insufficient information to code with codable children: Secondary | ICD-10-CM | POA: Diagnosis present

## 2013-06-07 DIAGNOSIS — R8271 Bacteriuria: Secondary | ICD-10-CM

## 2013-06-07 DIAGNOSIS — Z22330 Carrier of Group B streptococcus: Secondary | ICD-10-CM

## 2013-06-07 DIAGNOSIS — B3731 Acute candidiasis of vulva and vagina: Secondary | ICD-10-CM | POA: Diagnosis present

## 2013-06-07 DIAGNOSIS — O99892 Other specified diseases and conditions complicating childbirth: Secondary | ICD-10-CM | POA: Diagnosis present

## 2013-06-07 DIAGNOSIS — O3660X0 Maternal care for excessive fetal growth, unspecified trimester, not applicable or unspecified: Secondary | ICD-10-CM | POA: Diagnosis present

## 2013-06-07 NOTE — Discharge Instructions (Signed)

## 2013-06-07 NOTE — MAU Note (Signed)
Patient's husband states she is having contractions far apart but her back is hurting. Denies bleeding or leaking. Reports good fetal movement. Will need Burmiese for translation.

## 2013-06-07 NOTE — MAU Note (Signed)
Pt presents with complaint of ROM at 2200, contractions

## 2013-06-08 ENCOUNTER — Encounter (HOSPITAL_COMMUNITY): Payer: Self-pay | Admitting: Anesthesiology

## 2013-06-08 ENCOUNTER — Inpatient Hospital Stay (HOSPITAL_COMMUNITY): Payer: Medicaid Other | Admitting: Anesthesiology

## 2013-06-08 ENCOUNTER — Encounter (HOSPITAL_COMMUNITY)
Admission: AD | Disposition: A | Discharge status: Home / Self Care | Payer: Self-pay | Source: Ambulatory Visit | Attending: Obstetrics & Gynecology

## 2013-06-08 ENCOUNTER — Encounter (HOSPITAL_COMMUNITY): Payer: Medicaid Other | Admitting: Anesthesiology

## 2013-06-08 DIAGNOSIS — O2390 Unspecified genitourinary tract infection in pregnancy, unspecified trimester: Secondary | ICD-10-CM

## 2013-06-08 DIAGNOSIS — IMO0002 Reserved for concepts with insufficient information to code with codable children: Secondary | ICD-10-CM

## 2013-06-08 DIAGNOSIS — N390 Urinary tract infection, site not specified: Secondary | ICD-10-CM

## 2013-06-08 LAB — CBC
HEMATOCRIT: 35.8 % — AB (ref 36.0–46.0)
Hemoglobin: 12.1 g/dL (ref 12.0–15.0)
MCH: 28.1 pg (ref 26.0–34.0)
MCHC: 33.8 g/dL (ref 30.0–36.0)
MCV: 83.1 fL (ref 78.0–100.0)
PLATELETS: 258 10*3/uL (ref 150–400)
RBC: 4.31 MIL/uL (ref 3.87–5.11)
RDW: 14.3 % (ref 11.5–15.5)
WBC: 12.4 10*3/uL — AB (ref 4.0–10.5)

## 2013-06-08 LAB — WET PREP, GENITAL: Trich, Wet Prep: NONE SEEN

## 2013-06-08 LAB — TYPE AND SCREEN
ABO/RH(D): O POS
ANTIBODY SCREEN: POSITIVE
DAT, IGG: NEGATIVE
Unit division: 0
Unit division: 0

## 2013-06-08 LAB — COMPREHENSIVE METABOLIC PANEL
ALBUMIN: 2.7 g/dL — AB (ref 3.5–5.2)
ALK PHOS: 120 U/L — AB (ref 39–117)
ALT: 9 U/L (ref 0–35)
AST: 13 U/L (ref 0–37)
BILIRUBIN TOTAL: 0.3 mg/dL (ref 0.3–1.2)
BUN: 10 mg/dL (ref 6–23)
CHLORIDE: 105 meq/L (ref 96–112)
CO2: 19 mEq/L (ref 19–32)
Calcium: 9.1 mg/dL (ref 8.4–10.5)
Creatinine, Ser: 0.7 mg/dL (ref 0.50–1.10)
GFR calc Af Amer: >90 mL/min (ref >90)
GFR calc non Af Amer: >90 mL/min (ref >90)
Glucose, Bld: 99 mg/dL (ref 70–99)
POTASSIUM: 3.9 meq/L (ref 3.7–5.3)
SODIUM: 139 meq/L (ref 137–147)
TOTAL PROTEIN: 7.1 g/dL (ref 6.0–8.3)

## 2013-06-08 LAB — GLUCOSE, CAPILLARY: Glucose-Capillary: 85 mg/dL (ref 70–99)

## 2013-06-08 LAB — PROTEIN / CREATININE RATIO, URINE
CREATININE, URINE: 89.61 mg/dL
Protein Creatinine Ratio: 0.32 — ABNORMAL HIGH (ref 0.00–0.15)
Total Protein, Urine: 28.9 mg/dL

## 2013-06-08 LAB — POCT FERN TEST: POCT FERN TEST: NEGATIVE

## 2013-06-08 LAB — RPR: RPR Ser Ql: NONREACTIVE

## 2013-06-08 SURGERY — Surgical Case
Anesthesia: Spinal | Site: Abdomen

## 2013-06-08 MED ORDER — TERBUTALINE SULFATE 1 MG/ML IJ SOLN: 0.2500 mg | Freq: Once | INTRAMUSCULAR | Status: DC | PRN

## 2013-06-08 MED ORDER — EPHEDRINE 5 MG/ML INJ: 10.0000 mg | INTRAVENOUS | Status: DC | PRN

## 2013-06-08 MED ORDER — CEFAZOLIN SODIUM-DEXTROSE 2-3 GM-% IV SOLR
2.0000 g | INTRAVENOUS | Status: AC
  Administered 2013-06-08: 2 g via INTRAVENOUS
  Filled 2013-06-08: qty 50

## 2013-06-08 MED ORDER — DIBUCAINE 1 % RE OINT: 1.0000 "application " | TOPICAL_OINTMENT | RECTAL | Status: DC | PRN

## 2013-06-08 MED ORDER — 0.9 % SODIUM CHLORIDE (POUR BTL) OPTIME
TOPICAL | Status: DC | PRN
  Administered 2013-06-08: 1000 mL

## 2013-06-08 MED ORDER — NALBUPHINE HCL 10 MG/ML IJ SOLN: 5.0000 mg | INTRAMUSCULAR | Status: DC | PRN

## 2013-06-08 MED ORDER — ONDANSETRON HCL 4 MG/2ML IJ SOLN: 4.0000 mg | INTRAMUSCULAR | Status: DC | PRN

## 2013-06-08 MED ORDER — KETOROLAC TROMETHAMINE 30 MG/ML IJ SOLN
INTRAMUSCULAR | Status: AC
  Administered 2013-06-08: 30 mg via INTRAVENOUS
  Filled 2013-06-08: qty 1

## 2013-06-08 MED ORDER — SIMETHICONE 80 MG PO CHEW
80.0000 mg | CHEWABLE_TABLET | Freq: Three times a day (TID) | ORAL | Status: DC
  Administered 2013-06-08 – 2013-06-10 (×5): 80 mg via ORAL
  Filled 2013-06-08 (×5): qty 1

## 2013-06-08 MED ORDER — MEPERIDINE HCL 25 MG/ML IJ SOLN
6.2500 mg | INTRAMUSCULAR | Status: DC | PRN
  Administered 2013-06-08: 12.5 mg via INTRAVENOUS

## 2013-06-08 MED ORDER — ONDANSETRON HCL 4 MG/2ML IJ SOLN: 4.0000 mg | Freq: Four times a day (QID) | INTRAMUSCULAR | Status: DC | PRN

## 2013-06-08 MED ORDER — MEASLES, MUMPS & RUBELLA VAC ~~LOC~~ INJ
0.5000 mL | INJECTION | Freq: Once | SUBCUTANEOUS | Status: DC
  Filled 2013-06-08: qty 0.5

## 2013-06-08 MED ORDER — SIMETHICONE 80 MG PO CHEW
80.0000 mg | CHEWABLE_TABLET | ORAL | Status: DC
  Administered 2013-06-09 (×2): 80 mg via ORAL
  Filled 2013-06-08 (×2): qty 1

## 2013-06-08 MED ORDER — ZOLPIDEM TARTRATE 5 MG PO TABS
5.0000 mg | ORAL_TABLET | Freq: Every evening | ORAL | Status: DC | PRN
Start: 2013-06-08 — End: 2013-06-10

## 2013-06-08 MED ORDER — TETANUS-DIPHTH-ACELL PERTUSSIS 5-2.5-18.5 LF-MCG/0.5 IM SUSP: 0.5000 mL | Freq: Once | INTRAMUSCULAR | Status: DC

## 2013-06-08 MED ORDER — MEPERIDINE HCL 25 MG/ML IJ SOLN
INTRAMUSCULAR | Status: AC
  Filled 2013-06-08: qty 1

## 2013-06-08 MED ORDER — NALOXONE HCL 0.4 MG/ML IJ SOLN: 0.4000 mg | INTRAMUSCULAR | Status: DC | PRN

## 2013-06-08 MED ORDER — ACETAMINOPHEN 500 MG PO TABS: 1000.0000 mg | ORAL_TABLET | Freq: Four times a day (QID) | ORAL | Status: AC

## 2013-06-08 MED ORDER — BUPIVACAINE HCL (PF) 0.5 % IJ SOLN
INTRAMUSCULAR | Status: AC
  Filled 2013-06-08: qty 30

## 2013-06-08 MED ORDER — OXYTOCIN 40 UNITS IN LACTATED RINGERS INFUSION - SIMPLE MED
62.5000 mL/h | INTRAVENOUS | Status: DC
  Filled 2013-06-08: qty 1000

## 2013-06-08 MED ORDER — OXYTOCIN 10 UNIT/ML IJ SOLN
INTRAMUSCULAR | Status: AC
  Filled 2013-06-08: qty 4

## 2013-06-08 MED ORDER — OXYCODONE-ACETAMINOPHEN 5-325 MG PO TABS: 1.0000 | ORAL_TABLET | ORAL | Status: DC | PRN

## 2013-06-08 MED ORDER — PHENYLEPHRINE 8 MG IN D5W 100 ML (0.08MG/ML) PREMIX OPTIME
INJECTION | INTRAVENOUS | Status: DC | PRN
  Administered 2013-06-08: 60 ug/min via INTRAVENOUS

## 2013-06-08 MED ORDER — KETOROLAC TROMETHAMINE 30 MG/ML IJ SOLN
30.0000 mg | Freq: Four times a day (QID) | INTRAMUSCULAR | Status: AC | PRN
  Administered 2013-06-08 (×2): 30 mg via INTRAVENOUS
  Filled 2013-06-08: qty 1

## 2013-06-08 MED ORDER — PROMETHAZINE HCL 25 MG/ML IJ SOLN: 6.2500 mg | INTRAMUSCULAR | Status: DC | PRN

## 2013-06-08 MED ORDER — LACTATED RINGERS IV BOLUS (SEPSIS)
500.0000 mL | Freq: Once | INTRAVENOUS | Status: AC
  Administered 2013-06-08: 500 mL via INTRAVENOUS

## 2013-06-08 MED ORDER — ONDANSETRON HCL 4 MG PO TABS: 4.0000 mg | ORAL_TABLET | ORAL | Status: DC | PRN

## 2013-06-08 MED ORDER — FENTANYL CITRATE 0.05 MG/ML IJ SOLN
INTRAMUSCULAR | Status: DC | PRN
  Administered 2013-06-08: 15 ug via INTRATHECAL

## 2013-06-08 MED ORDER — FENTANYL CITRATE 0.05 MG/ML IJ SOLN
INTRAMUSCULAR | Status: AC
  Filled 2013-06-08: qty 2

## 2013-06-08 MED ORDER — BUPIVACAINE HCL (PF) 0.5 % IJ SOLN
INTRAMUSCULAR | Status: DC | PRN
  Administered 2013-06-08: 30 mL

## 2013-06-08 MED ORDER — METOCLOPRAMIDE HCL 5 MG/ML IJ SOLN: 10.0000 mg | Freq: Three times a day (TID) | INTRAMUSCULAR | Status: DC | PRN

## 2013-06-08 MED ORDER — WITCH HAZEL-GLYCERIN EX PADS: 1.0000 "application " | MEDICATED_PAD | CUTANEOUS | Status: DC | PRN

## 2013-06-08 MED ORDER — ONDANSETRON HCL 4 MG/2ML IJ SOLN: 4.0000 mg | Freq: Three times a day (TID) | INTRAMUSCULAR | Status: DC | PRN

## 2013-06-08 MED ORDER — LANOLIN HYDROUS EX OINT: 1.0000 "application " | TOPICAL_OINTMENT | CUTANEOUS | Status: DC | PRN

## 2013-06-08 MED ORDER — FLUCONAZOLE 150 MG PO TABS
150.0000 mg | ORAL_TABLET | Freq: Once | ORAL | Status: AC
  Administered 2013-06-08: 150 mg via ORAL
  Filled 2013-06-08: qty 1

## 2013-06-08 MED ORDER — DIPHENHYDRAMINE HCL 25 MG PO CAPS
25.0000 mg | ORAL_CAPSULE | ORAL | Status: DC | PRN
  Filled 2013-06-08: qty 1

## 2013-06-08 MED ORDER — KETOROLAC TROMETHAMINE 60 MG/2ML IM SOLN
INTRAMUSCULAR | Status: AC
  Filled 2013-06-08: qty 2

## 2013-06-08 MED ORDER — LACTATED RINGERS IV SOLN
INTRAVENOUS | Status: DC
  Administered 2013-06-08 (×3): via INTRAVENOUS

## 2013-06-08 MED ORDER — PRENATAL MULTIVITAMIN CH
1.0000 | ORAL_TABLET | Freq: Every day | ORAL | Status: DC
  Administered 2013-06-09: 1 via ORAL
  Filled 2013-06-08: qty 1

## 2013-06-08 MED ORDER — LACTATED RINGERS IV SOLN: 500.0000 mL | Freq: Once | INTRAVENOUS | Status: DC

## 2013-06-08 MED ORDER — NALOXONE HCL 1 MG/ML IJ SOLN
1.0000 ug/kg/h | INTRAVENOUS | Status: DC | PRN
  Filled 2013-06-08: qty 2

## 2013-06-08 MED ORDER — LACTATED RINGERS IV SOLN
500.0000 mL | INTRAVENOUS | Status: DC | PRN
  Administered 2013-06-08: 500 mL via INTRAVENOUS
  Administered 2013-06-08: 300 mL via INTRAVENOUS
  Administered 2013-06-08: 500 mL via INTRAVENOUS

## 2013-06-08 MED ORDER — PHENYLEPHRINE 40 MCG/ML (10ML) SYRINGE FOR IV PUSH (FOR BLOOD PRESSURE SUPPORT)
80.0000 ug | PREFILLED_SYRINGE | INTRAVENOUS | Status: DC | PRN
Start: 2013-06-08 — End: 2013-06-08

## 2013-06-08 MED ORDER — KETOROLAC TROMETHAMINE 30 MG/ML IJ SOLN: 30.0000 mg | Freq: Four times a day (QID) | INTRAMUSCULAR | Status: AC | PRN

## 2013-06-08 MED ORDER — FENTANYL 2.5 MCG/ML BUPIVACAINE 1/10 % EPIDURAL INFUSION (WH - ANES): 14.0000 mL/h | INTRAMUSCULAR | Status: DC | PRN

## 2013-06-08 MED ORDER — SCOPOLAMINE 1 MG/3DAYS TD PT72
MEDICATED_PATCH | TRANSDERMAL | Status: AC
  Filled 2013-06-08: qty 1

## 2013-06-08 MED ORDER — ONDANSETRON HCL 4 MG/2ML IJ SOLN
INTRAMUSCULAR | Status: DC | PRN
  Administered 2013-06-08: 4 mg via INTRAVENOUS

## 2013-06-08 MED ORDER — OXYTOCIN 40 UNITS IN LACTATED RINGERS INFUSION - SIMPLE MED
1.0000 m[IU]/min | INTRAVENOUS | Status: DC
  Administered 2013-06-08: 2 m[IU]/min via INTRAVENOUS

## 2013-06-08 MED ORDER — BUPIVACAINE IN DEXTROSE 0.75-8.25 % IT SOLN
INTRATHECAL | Status: DC | PRN
  Administered 2013-06-08: 1.4 mL via INTRATHECAL

## 2013-06-08 MED ORDER — MORPHINE SULFATE 0.5 MG/ML IJ SOLN
INTRAMUSCULAR | Status: AC
  Filled 2013-06-08: qty 10

## 2013-06-08 MED ORDER — IBUPROFEN 600 MG PO TABS
600.0000 mg | ORAL_TABLET | Freq: Four times a day (QID) | ORAL | Status: DC
  Administered 2013-06-09 (×4): 600 mg via ORAL
  Filled 2013-06-08 (×5): qty 1

## 2013-06-08 MED ORDER — OXYTOCIN BOLUS FROM INFUSION: 500.0000 mL | INTRAVENOUS | Status: DC

## 2013-06-08 MED ORDER — DIPHENHYDRAMINE HCL 50 MG/ML IJ SOLN: 25.0000 mg | INTRAMUSCULAR | Status: DC | PRN

## 2013-06-08 MED ORDER — FENTANYL CITRATE 0.05 MG/ML IJ SOLN
100.0000 ug | INTRAMUSCULAR | Status: DC | PRN
  Administered 2013-06-08 (×3): 100 ug via INTRAVENOUS
  Filled 2013-06-08 (×3): qty 2

## 2013-06-08 MED ORDER — IBUPROFEN 600 MG PO TABS: 600.0000 mg | ORAL_TABLET | Freq: Four times a day (QID) | ORAL | Status: DC | PRN

## 2013-06-08 MED ORDER — OXYTOCIN 40 UNITS IN LACTATED RINGERS INFUSION - SIMPLE MED: 62.5000 mL/h | INTRAVENOUS | Status: AC

## 2013-06-08 MED ORDER — PHENYLEPHRINE 40 MCG/ML (10ML) SYRINGE FOR IV PUSH (FOR BLOOD PRESSURE SUPPORT): 80.0000 ug | PREFILLED_SYRINGE | INTRAVENOUS | Status: DC | PRN

## 2013-06-08 MED ORDER — FENTANYL CITRATE 0.05 MG/ML IJ SOLN: 25.0000 ug | INTRAMUSCULAR | Status: DC | PRN

## 2013-06-08 MED ORDER — MORPHINE SULFATE (PF) 0.5 MG/ML IJ SOLN
INTRAMUSCULAR | Status: DC | PRN
  Administered 2013-06-08: .1 mg via INTRATHECAL

## 2013-06-08 MED ORDER — OXYTOCIN 10 UNIT/ML IJ SOLN
40.0000 [IU] | INTRAVENOUS | Status: DC | PRN
  Administered 2013-06-08: 40 [IU] via INTRAVENOUS

## 2013-06-08 MED ORDER — FLEET ENEMA 7-19 GM/118ML RE ENEM: 1.0000 | ENEMA | RECTAL | Status: DC | PRN

## 2013-06-08 MED ORDER — DIPHENHYDRAMINE HCL 50 MG/ML IJ SOLN: 12.5000 mg | INTRAMUSCULAR | Status: DC | PRN

## 2013-06-08 MED ORDER — MIDAZOLAM HCL 2 MG/2ML IJ SOLN: 0.5000 mg | Freq: Once | INTRAMUSCULAR | Status: DC | PRN

## 2013-06-08 MED ORDER — FENTANYL CITRATE 0.05 MG/ML IJ SOLN
100.0000 ug | Freq: Once | INTRAMUSCULAR | Status: AC
  Administered 2013-06-08: 100 ug via INTRAVENOUS
  Filled 2013-06-08: qty 2

## 2013-06-08 MED ORDER — ONDANSETRON HCL 4 MG/2ML IJ SOLN
INTRAMUSCULAR | Status: AC
  Filled 2013-06-08: qty 2

## 2013-06-08 MED ORDER — LIDOCAINE HCL (PF) 1 % IJ SOLN: 30.0000 mL | INTRAMUSCULAR | Status: DC | PRN

## 2013-06-08 MED ORDER — PHENYLEPHRINE HCL 10 MG/ML IJ SOLN
INTRAMUSCULAR | Status: AC
  Filled 2013-06-08: qty 1

## 2013-06-08 MED ORDER — MENTHOL 3 MG MT LOZG: 1.0000 | LOZENGE | OROMUCOSAL | Status: DC | PRN

## 2013-06-08 MED ORDER — PENICILLIN G POTASSIUM 5000000 UNITS IJ SOLR
5.0000 10*6.[IU] | Freq: Once | INTRAVENOUS | Status: AC
  Administered 2013-06-08: 5 10*6.[IU] via INTRAVENOUS
  Filled 2013-06-08: qty 5

## 2013-06-08 MED ORDER — SIMETHICONE 80 MG PO CHEW: 80.0000 mg | CHEWABLE_TABLET | ORAL | Status: DC | PRN

## 2013-06-08 MED ORDER — CITRIC ACID-SODIUM CITRATE 334-500 MG/5ML PO SOLN
30.0000 mL | ORAL | Status: DC | PRN
  Administered 2013-06-08: 30 mL via ORAL
  Filled 2013-06-08: qty 15

## 2013-06-08 MED ORDER — DIPHENHYDRAMINE HCL 25 MG PO CAPS: 25.0000 mg | ORAL_CAPSULE | Freq: Four times a day (QID) | ORAL | Status: DC | PRN

## 2013-06-08 MED ORDER — SENNOSIDES-DOCUSATE SODIUM 8.6-50 MG PO TABS
2.0000 | ORAL_TABLET | ORAL | Status: DC
  Administered 2013-06-09 (×2): 2 via ORAL
  Filled 2013-06-08 (×2): qty 2

## 2013-06-08 MED ORDER — ACETAMINOPHEN 325 MG PO TABS: 650.0000 mg | ORAL_TABLET | ORAL | Status: DC | PRN

## 2013-06-08 MED ORDER — SODIUM CHLORIDE 0.9 % IJ SOLN: 3.0000 mL | INTRAMUSCULAR | Status: DC | PRN

## 2013-06-08 MED ORDER — LACTATED RINGERS IV SOLN
INTRAVENOUS | Status: DC
Start: 2013-06-08 — End: 2013-06-09
  Administered 2013-06-09: 01:00:00 via INTRAVENOUS

## 2013-06-08 MED ORDER — MEPERIDINE HCL 25 MG/ML IJ SOLN: 6.2500 mg | INTRAMUSCULAR | Status: DC | PRN

## 2013-06-08 MED ORDER — SCOPOLAMINE 1 MG/3DAYS TD PT72
1.0000 | MEDICATED_PATCH | Freq: Once | TRANSDERMAL | Status: DC
  Administered 2013-06-08: 1.5 mg via TRANSDERMAL

## 2013-06-08 MED ORDER — LACTATED RINGERS IV SOLN: INTRAVENOUS | Status: DC

## 2013-06-08 MED ORDER — PENICILLIN G POTASSIUM 5000000 UNITS IJ SOLR
2.5000 10*6.[IU] | INTRAVENOUS | Status: DC
  Administered 2013-06-08 (×2): 2.5 10*6.[IU] via INTRAVENOUS
  Filled 2013-06-08 (×6): qty 2.5

## 2013-06-08 SURGICAL SUPPLY — 41 items
BENZOIN TINCTURE PRP APPL 2/3 (GAUZE/BANDAGES/DRESSINGS) ×2 IMPLANT
BINDER ABD UNIV 10 28-50 (GAUZE/BANDAGES/DRESSINGS) ×1 IMPLANT
BINDER ABD UNIV 12 45-62 (WOUND CARE) IMPLANT
BINDER ABDOM UNIV 10 (GAUZE/BANDAGES/DRESSINGS) ×2
BINDER ABDOMINAL 46IN 62IN (WOUND CARE)
CLAMP CORD UMBIL (MISCELLANEOUS) IMPLANT
CLOTH BEACON ORANGE TIMEOUT ST (SAFETY) ×2 IMPLANT
DRAPE LG THREE QUARTER DISP (DRAPES) IMPLANT
DRSG OPSITE POSTOP 4X10 (GAUZE/BANDAGES/DRESSINGS) ×2 IMPLANT
DURAPREP 26ML APPLICATOR (WOUND CARE) ×2 IMPLANT
ELECT REM PT RETURN 9FT ADLT (ELECTROSURGICAL) ×2
ELECTRODE REM PT RTRN 9FT ADLT (ELECTROSURGICAL) ×1 IMPLANT
EXTRACTOR VACUUM KIWI (MISCELLANEOUS) ×2 IMPLANT
GLOVE BIO SURGEON STRL SZ7 (GLOVE) ×4 IMPLANT
GLOVE BIOGEL PI IND STRL 7.0 (GLOVE) ×4 IMPLANT
GLOVE BIOGEL PI INDICATOR 7.0 (GLOVE) ×4
GLOVE ECLIPSE 7.0 STRL STRAW (GLOVE) ×2 IMPLANT
GOWN STRL REUS W/ TWL XL LVL3 (GOWN DISPOSABLE) ×1 IMPLANT
GOWN STRL REUS W/TWL LRG LVL3 (GOWN DISPOSABLE) ×2 IMPLANT
GOWN STRL REUS W/TWL XL LVL3 (GOWN DISPOSABLE) ×1
KIT ABG SYR 3ML LUER SLIP (SYRINGE) IMPLANT
NEEDLE HYPO 22GX1.5 SAFETY (NEEDLE) ×2 IMPLANT
NEEDLE HYPO 25X5/8 SAFETYGLIDE (NEEDLE) IMPLANT
NS IRRIG 1000ML POUR BTL (IV SOLUTION) ×2 IMPLANT
PACK C SECTION WH (CUSTOM PROCEDURE TRAY) ×2 IMPLANT
PAD OB MATERNITY 4.3X12.25 (PERSONAL CARE ITEMS) ×2 IMPLANT
RTRCTR C-SECT PINK 25CM LRG (MISCELLANEOUS) IMPLANT
SPONGE SURGIFOAM ABS GEL 12-7 (HEMOSTASIS) IMPLANT
STAPLER VISISTAT 35W (STAPLE) IMPLANT
STRIP CLOSURE SKIN 1/2X4 (GAUZE/BANDAGES/DRESSINGS) ×2 IMPLANT
SUT PDS AB 0 CTX 60 (SUTURE) IMPLANT
SUT PLAIN 0 NONE (SUTURE) IMPLANT
SUT SILK 0 TIES 10X30 (SUTURE) IMPLANT
SUT VIC AB 0 CT1 36 (SUTURE) ×6 IMPLANT
SUT VIC AB 3-0 CT1 27 (SUTURE) ×1
SUT VIC AB 3-0 CT1 TAPERPNT 27 (SUTURE) ×1 IMPLANT
SUT VIC AB 4-0 KS 27 (SUTURE) ×2 IMPLANT
SYR CONTROL 10ML LL (SYRINGE) ×2 IMPLANT
TOWEL OR 17X24 6PK STRL BLUE (TOWEL DISPOSABLE) ×2 IMPLANT
TRAY FOLEY CATH 14FR (SET/KITS/TRAYS/PACK) ×2 IMPLANT
WATER STERILE IRR 1000ML POUR (IV SOLUTION) ×2 IMPLANT

## 2013-06-08 NOTE — H&P (Signed)
Attestation of Attending Supervision of Advanced Practitioner (PA/CNM/NP): Evaluation and management procedures were performed by the Advanced Practitioner under my supervision and collaboration.  I have reviewed the Advanced Practitioner's note and chart, and I agree with the management and plan.  Comer Devins S, MD [facility] Attending 06/08/2013 7:16 AM   

## 2013-06-08 NOTE — H&P (Signed)
HPI   28 y.o.G3P0111 at 40.1 by US presents with contractions q10 minutes and +LOF.  Patient is Burmese and translator was used via phone.   Patient states contractions started this morning q 30 minutes.  Denies vaginal bleeding.  She reports LOF at 10pm tonight, which soaked her underwear and her pants.  ROS otherwise negative.    Patient was treated recently for UTI with antibiotics and reports she is still taking that medication.  She had a  + 1hr GTT and 1/3 + 3hr GTT. She is not checking her BG regularly.      Past Medical History   Diagnosis  Date   .  Medical history non-contributory         Past Surgical History   Procedure  Laterality  Date   .  No past surgeries        History reviewed. No pertinent family history.    History   Substance Use Topics   .  Smoking status:  Passive Smoke Exposure - Never Smoker   .  Smokeless tobacco:  Never Used   .  Alcohol Use:  No      Allergies: No Known Allergies    Prescriptions prior to admission   Medication  Sig  Dispense  Refill   .  cephALEXin (KEFLEX) 500 MG capsule  Take 1 capsule (500 mg total) by mouth 4 (four) times daily.   28 capsule   0   .  Prenatal Vit-Fe Fumarate-FA (PRENATAL MULTIVITAMIN) TABS tablet  Take 1 tablet by mouth daily at 12 noon.            Review of Systems  Constitutional: Negative for fever and chills.  Eyes: Negative for blurred vision.  Cardiovascular: Negative for chest pain.  Gastrointestinal: Negative for nausea, vomiting and diarrhea.        Lower abd pain comes and goes  Genitourinary: Negative for dysuria.  Musculoskeletal: Positive for back pain.  Neurological: Negative for dizziness and headaches.     Physical Exam      Blood pressure 112/86, pulse 118, temperature 97.6 F (36.4 C), temperature source Oral, resp. rate 18, height 5' 4.5" (1.638 m), weight 94.348 kg (208 lb), last menstrual period 08/31/2012, SpO2 100.00%.   Physical Exam  Constitutional: She is oriented  to person, place, and time. She appears well-developed and well-nourished.  HENT:   Head: Normocephalic and atraumatic.  Eyes: Conjunctivae are normal.  Cardiovascular: Intact distal pulses.  Heart RRR.  Respiratory: Effort normal. No respiratory distress. CTABL GI: Soft.  Appropriately gravid uterus. Leopold estimate of 8 lb fetus  Genitourinary: Vagina normal.  Dilation: 3 Effacement (%): 60 Cervical Position: Middle Station: -3 Presentation: Vertex Exam by:: Elbert EwingsL. Munford RN   Speculum exam: No pooling. Profuse white chunky discharge.  No foul odor. Cervical os not visualized. Vaginal tissue beefy red.  No blood.  Musculoskeletal: She exhibits no edema and no tenderness.  Neurological: She is oriented to person, place, and time.  Skin: Skin is warm and dry.      Fern Test Neg. Wet prep + yeast, clue cells   FWB: Cat 1. Baseline 140. Moderate variability. Accels present. Decels absent. Maternal contractions regular q 6-8 minutes.   Repeat Cervical Exam:    Dilation: 4 Effacement (%): 60 Cervical Position: Middle Station: -3 Presentation: Vertex Exam by:: Gaye AlkenL. Munford RN      MAU Course    Procedures   - SSE - Cervical exam + repeat - Wet prep -  Fern test - FHT    Assessment and Plan     #  Latent labor, High risk term pregnancy - Admit to L&D for active management of labor - Desires Epidural - No ROM - FWB Cat 1 - + Cervical change in 2 hrs - High Risk pregnancy w/ Prior PTL at 24 wks (twins, one survived) - Concern for insulin resistance/ 1/3+  On 3hrGTT. Will check glucose now and consider regular CBG's based on results. - GBS+ - start PCN  # Elevated diastolic BP - Check Prot/Cr ratio and CMP - monitor    # Vaginal Candida - tx fluconazole x1  # UTI - Enterococcus and E.coli - continue Keflex   Quincy Simmonds 06/08/2013, 12:59 AM       I have seen and examined this patient and agree with above documentation in the resident's note. 28  yo [id number] @ [redacted]w[redacted]d by 22wk [place] who presents for contractions and ?LOF. Fern and spec negative. Cervical change in 1.5 hours. Will admit to L&D for labor. R/o labs for pre-E due to elevated BP in MAU. Plan to observe for now and if no change, start pitocin. VTX by nurse and resident check. GBS + will treat with PCN. EFW 8-8.5#. Cat I tracing.    Rulon Abide, M.D. [facility] Fellow 06/08/2013 2:34 AM

## 2013-06-08 NOTE — MAU Provider Note (Signed)
Attestation of Attending Supervision of Advanced Practitioner (PA/CNM/NP): Evaluation and management procedures were performed by the Advanced Practitioner under my supervision and collaboration.  I have reviewed the Advanced Practitioner's note and chart, and I agree with the management and plan.  Reva BoresPRATT,Anthany Thornhill S, MD [facility] Attending 06/08/2013 7:16 AM

## 2013-06-08 NOTE — Op Note (Signed)
Cheryl Wilkerson  1:13 PM  PATIENT: Cheryl Wilkerson 28 y.o. female  PRE-OPERATIVE DIAGNOSIS: failure to desend  POST-OPERATIVE DIAGNOSIS: failure to desend  PROCEDURE: Procedure(s):  CESAREAN SECTION (N/A)  SURGEON: Surgeon(s) and Role:  * Willodean Rosenthal, MD - Primary  ASSISTANTS: Hinda Lenis, M.D.  ANESTHESIA: spinal  EBL: Total I/O  In: 1000 [I.V.:1000]  Out: 800 [Urine:300; Blood:500]  BLOOD ADMINISTERED:none  DRAINS: none  LOCAL MEDICATIONS USED: MARCAINE  SPECIMEN: Source of Specimen: placenta  DISPOSITION OF SPECIMEN: L&D  COUNTS: YES  TOURNIQUET: * No tourniquets in log *  DICTATION: .Note written in EPIC  PLAN OF CARE: Admit to inpatient  PATIENT DISPOSITION: PACU - hemodynamically stable.  Delay start of Pharmacological VTE agent (>24hrs) due to surgical blood loss or risk of bleeding: yes  INDICATIONS: Cheryl Wilkerson is a 28 y.o. [id number] at [redacted]w[redacted]d here for cesarean section secondary to the indications listed under preoperative diagnosis; please see preoperative note for further details.  The risks of cesarean section were discussed with the patient including but were not limited to: bleeding which may require transfusion or reoperation; infection which may require antibiotics; injury to bowel, bladder, ureters or other surrounding organs; injury to the fetus; need for additional procedures including hysterectomy in the event of a life-threatening hemorrhage; placental abnormalities wth subsequent pregnancies, incisional problems, thromboembolic phenomenon and other postoperative/anesthesia complications.   The patient concurred with the proposed plan, giving informed written consent for the procedure.    FINDINGS:  Viable female infant in cephalic presentation.  Apgars pending.  Clear amniotic fluid.  Intact placenta, three vessel cord.  Normal uterus, fallopian tubes and ovaries bilaterally.   PROCEDURE IN DETAIL:  The patient preoperatively received intravenous antibiotics and had sequential  compression devices applied to her lower extremities.  She was then taken to the operating room where spinal anesthesia was administered and was found to be adequate. She was then placed in a dorsal supine position with a leftward tilt, and prepped and draped in a sterile manner.  A foley catheter was placed into her bladder and attached to constant gravity.  After an adequate timeout was performed, a Pfannenstiel skin incision was made with scalpel and carried through to the underlying layer of fascia. The fascia was incised in the midline, and this incision was extended bilaterally using the Mayo scissors.  Kocher clamps were applied to the superior aspect of the fascial incision and the underlying rectus muscles were dissected off bluntly. A similar process was carried out on the inferior aspect of the fascial incision. The rectus muscles were separated in the midline bluntly and the peritoneum was entered bluntly. Attention was turned to the lower uterine segment where a low transverse hysterotomy incision was made with a scalpel and extended bilaterally bluntly.  The infant was successfully delivered, the cord was clamped and cut and the infant was handed over to awaiting neonatology team. Uterine massage was then administered, and the placenta delivered intact with a three-vessel cord. The uterus was then cleared of clot and debris.  The hysterotomy was closed with 0 Vicryl in a running locked fashion, and an imbricating layer was also placed with the same suture. The pelvis was cleared of all clot and debris. Hemostasis was confirmed on all surfaces.  The peritoneum and the muscles were reapproximated using 0Vicryl in 1 interrupted suture. The fascia was then closed using 0 Vicryl in a running fashion. The skin was closed with a 4-0 Vicryl subcuticular stitch.  30 cc of 0.5% marcaine  was injected into the incision and benzoin and steristrips were applied.  The patient tolerated the procedure well. Sponge,  lap, instrument and needle counts were correct x 2.  She was taken to the recovery room in stable condition.

## 2013-06-08 NOTE — Progress Notes (Signed)
Cheryl Wilkerson is a 28 y.o. [id number] at 454w1d by ultrasound admitted for active labor. Pt c/o painful ctx.  She has made no descent despite labor.  She was 4cm at admission and is now 5cm.   Her 1hour glc was abnormal with a normal 3hr with 1 abnormal value.  She had an elevated glc in the clinic recently.      Subjective: Pt c/o pain with contraction s.  Requesting pain meds through interpreter  Objective: BP 111/73  Pulse 104  Temp(Src) 97.4 F (36.3 C) (Oral)  Resp 20  Ht 5' 4.5" (1.638 m)  Wt 208 lb (94.348 kg)  BMI 35.16 kg/m2  SpO2 100%  LMP 08/31/2012      FHT:  FHR: 140's bpm, variability: minimal ,  accelerations:  Present,  decelerations:  Present Cat II UC:   regular, every 2-3 minutes SVE:   Dilation: 5 Effacement (%): 70 Station: -3 Exam by:: Dr Erin FullingHarraway-Smith  Labs: Lab Results  Component Value Date   WBC 12.4* 06/08/2013   HGB 12.1 06/08/2013   HCT 35.8* 06/08/2013   MCV 83.1 06/08/2013   PLT 258 06/08/2013    Assessment / Plan: arrest of decent- pt with large fetus and narrow pelvic an dfetus is not in pelvis despite adequate contractions.  Labor: no descent despite adequate contractions Fetal Wellbeing:  Category II Pain Control:  Labor support without medications I/D:  n/a Anticipated MOD:  will proceed with cesarean delivery due to failure to descend with large fetus for pts pelvis  HARRAWAY-SMITH, Cheryl Wilkerson 06/08/2013, 11:56 AM

## 2013-06-08 NOTE — Anesthesia Preprocedure Evaluation (Signed)
Anesthesia Evaluation  Patient identified by MRN, date of birth, ID band Patient awake    Reviewed: Allergy & Precautions, H&P , NPO status , Patient's Chart, lab work & pertinent test results  Airway Mallampati: II       Dental   Pulmonary  breath sounds clear to auscultation        Cardiovascular Exercise Tolerance: Good Rhythm:regular Rate:Normal     Neuro/Psych    GI/Hepatic   Endo/Other    Renal/GU      Musculoskeletal   Abdominal   Peds  Hematology   Anesthesia Other Findings   Reproductive/Obstetrics (+) Pregnancy                             Anesthesia Physical Anesthesia Plan  ASA: II  Anesthesia Plan: Spinal   Post-op Pain Management:    Induction:   Airway Management Planned:   Additional Equipment:   Intra-op Plan:   Post-operative Plan:   Informed Consent: I have reviewed the patients History and Physical, chart, labs and discussed the procedure including the risks, benefits and alternatives for the proposed anesthesia with the patient or authorized representative who has indicated his/her understanding and acceptance.     Plan Discussed with: Anesthesiologist, CRNA and Surgeon  Anesthesia Plan Comments:         Anesthesia Quick Evaluation  

## 2013-06-08 NOTE — Anesthesia Postprocedure Evaluation (Signed)
  Anesthesia Post-op Note  Anesthesia Post Note  Patient: Cheryl Wilkerson  Procedure(s) Performed: Procedure(s) (LRB): CESAREAN SECTION (N/A)  Anesthesia type: Spinal  Patient location: PACU  Post pain: Pain level controlled  Post assessment: Post-op Vital signs reviewed  Last Vitals:  Filed Vitals:   06/08/13 1400  BP: 124/98  Pulse: 87  Temp: 36.9 C  Resp: 22    Post vital signs: Reviewed  Level of consciousness: awake  Complications: No apparent anesthesia complications

## 2013-06-08 NOTE — Transfer of Care (Signed)
Immediate Anesthesia Transfer of Care Note  Patient: Cheryl Wilkerson  Procedure(s) Performed: Procedure(s): CESAREAN SECTION (N/A)  Patient Location: PACU  Anesthesia Type:Spinal  Level of Consciousness: awake, alert  and oriented  Airway & Oxygen Therapy: Patient Spontanous Breathing  Post-op Assessment: Report given to PACU RN and Post -op Vital signs reviewed and stable  Post vital signs: Reviewed and stable  Complications: No apparent anesthesia complications

## 2013-06-08 NOTE — Brief Op Note (Signed)
06/07/2013 - 06/08/2013  1:13 PM  PATIENT:  Cheryl Wilkerson  28 y.o. female  PRE-OPERATIVE DIAGNOSIS:  failure to desend  POST-OPERATIVE DIAGNOSIS:  failure to desend  PROCEDURE:  Procedure(s): CESAREAN SECTION (N/A)  SURGEON:  Surgeon(s) and Role:    * Willodean Rosenthalarolyn Harraway-Smith, MD - Primary  ASSISTANTS: Hinda Lenisyan Odom, M.D.   ANESTHESIA:   spinal  EBL:  Total I/O In: 1000 [I.V.:1000] Out: 800 [Urine:300; Blood:500]  BLOOD ADMINISTERED:none  DRAINS: none   LOCAL MEDICATIONS USED:  MARCAINE     SPECIMEN:  Source of Specimen:  placenta  DISPOSITION OF SPECIMEN:  L&D  COUNTS:  YES  TOURNIQUET:  * No tourniquets in log *  DICTATION: .Note written in EPIC  PLAN OF CARE: Admit to inpatient   PATIENT DISPOSITION:  PACU - hemodynamically stable.   Delay start of Pharmacological VTE agent (>24hrs) due to surgical blood loss or risk of bleeding: yes

## 2013-06-08 NOTE — Progress Notes (Signed)
Cheryl Wilkerson is a 28 y.o. [id number] at 2453w1d by ultrasound admitted for active labor  Subjective: Late entry. Saw patient at about 4:50a  Patient wincing during contractions. Requesting pain meds. Clarified with nurse and interpreter that she prefers something IV rather than epidural at this point.  Objective: BP 108/64  Pulse 79  Temp(Src) 98.2 F (36.8 C) (Oral)  Resp 18  Ht 5' 4.5" (1.638 m)  Wt 94.348 kg (208 lb)  BMI 35.16 kg/m2  SpO2 100%  LMP 08/31/2012      FHT:  FHR: 140 bpm, variability: minimal ,  accelerations:  Abscent,  decelerations:  Absent UC:   irregular, every 2-5 minutes SVE:   Dilation: 4 Effacement (%): 60 Station: -2 Exam by:: Dr Alonna BucklerFeeney   Labs: Lab Results  Component Value Date   WBC 12.4* Wilkerson   HGB 12.1 Wilkerson   HCT 35.8* Wilkerson   MCV 83.1 Wilkerson   PLT 258 Wilkerson    Assessment / Plan: Spontaneous labor, progressing normally. Consider starting pitocin as no change from prior exam.  Labor: Progressing normally Preeclampsia:  Prot/Creat = 0.32.  Elevated diastolic BP. now stable Fetal Wellbeing:  Category II Pain Control:  Fentanyl and Desires later epidural I/D:  GBS+urine.  PCN.  Keflex for UTI Anticipated MOD:  NSVD  Quincy SimmondsFeeney, Cheryl Wilkerson, Cheryl Wilkerson  I have seen and examined this patient and agree with above documentation in the resident's note with the following changes:  FHT- 140, mod variability, +accels, no decels.  Category I tracing. Elevated protein/cr ratio with some mildly elevated BPs.  Some normotensive values. Will hold on Mag for now.  Start pitocin for now cervical change.   Rulon AbideKeli Mabelle Wilkerson, M.D. [facility] Wilkerson 6:14 Wilkerson

## 2013-06-08 NOTE — Anesthesia Procedure Notes (Signed)
Spinal  Patient location during procedure: OR Start time: 06/08/2013 12:15 PM Staffing Performed by: anesthesiologist  Preanesthetic Checklist Completed: patient identified, site marked, surgical consent, pre-op evaluation, timeout performed, IV checked, risks and benefits discussed and monitors and equipment checked Spinal Block Patient position: sitting Prep: site prepped and draped and DuraPrep Patient monitoring: blood pressure, continuous pulse ox and heart rate Approach: midline Location: L3-4 Injection technique: single-shot Needle Needle type: Pencan  Needle gauge: 24 G Needle length: 9 cm Assessment Sensory level: T3 Additional Notes Clear free flow CSF on first attempt.  No paresthesia.  Patient tolerated procedure well with no apparent complications.  Jasmine DecemberA. Cassidy, MD

## 2013-06-08 NOTE — MAU Provider Note (Signed)
History     CSN: [id number]  Arrival date and time: 06/07/13 2300   First Provider Initiated Contact with Patient 06/07/13 2338      Chief Complaint  Patient presents with  . Labor Eval  . Rupture of Membranes   HPI  28 y.o.G3P0111 at 40.1 by US presents with contractions q10 minutes and +LOF.  Patient is Burmese and translator was used via phone.   Patient states contractions started this morning q 30 minutes.  Denies vaginal bleeding.  She reports LOF at 10pm tonight, which soaked her underwear and her pants.  ROS otherwise negative.   Patient was treated recently for UTI with antibiotics and reports she is still taking that medication.  She had a  + 1hr GTT and 1/3 + 3hr GTT. She is not checking her BG regularly.   Past Medical History  Diagnosis Date  . Medical history non-contributory     Past Surgical History  Procedure Laterality Date  . No past surgeries      History reviewed. No pertinent family history.  History  Substance Use Topics  . Smoking status: Passive Smoke Exposure - Never Smoker  . Smokeless tobacco: Never Used  . Alcohol Use: No    Allergies: No Known Allergies  Prescriptions prior to admission  Medication Sig Dispense Refill  . cephALEXin (KEFLEX) 500 MG capsule Take 1 capsule (500 mg total) by mouth 4 (four) times daily.  28 capsule  0  . Prenatal Vit-Fe Fumarate-FA (PRENATAL MULTIVITAMIN) TABS tablet Take 1 tablet by mouth daily at 12 noon.        Review of Systems  Constitutional: Negative for fever and chills.  Eyes: Negative for blurred vision.  Cardiovascular: Negative for chest pain.  Gastrointestinal: Negative for nausea, vomiting and diarrhea.       Lower abd pain comes and goes  Genitourinary: Negative for dysuria.  Musculoskeletal: Positive for back pain.  Neurological: Negative for dizziness and headaches.   Physical Exam   Blood pressure 112/86, pulse 118, temperature 97.6 F (36.4 C), temperature source Oral, resp. rate  18, height 5' 4.5" (1.638 m), weight 94.348 kg (208 lb), last menstrual period 08/31/2012, SpO2 100.00%.  Physical Exam  Constitutional: She is oriented to person, place, and time. She appears well-developed and well-nourished.  HENT:  Head: Normocephalic and atraumatic.  Eyes: Conjunctivae are normal.  Cardiovascular: Intact distal pulses.   Respiratory: Effort normal. No respiratory distress.  GI: Soft.  Appropriately gravid uterus. Leopold estimate of 8 lb fetus  Genitourinary: Vagina normal.  Dilation: 3 Effacement (%): 60 Cervical Position: Middle Station: -3 Presentation: Vertex Exam by:: Elbert EwingsL. Munford RN   Speculum exam: No pooling. Profuse white chunky discharge.  No foul odor. Cervical os not visualized. Vaginal tissue beefy red.  No blood.  Musculoskeletal: She exhibits no edema and no tenderness.  Neurological: She is oriented to person, place, and time.  Skin: Skin is warm and dry.    Fern Test Neg. Wet prep + yeast, clue cells  FWB: Cat 1. Baseline 140. Moderate variability. Accels present. Decels absent. Maternal contractions regular q 6-8 minutes.  Repeat Cervical Exam:   Dilation: 4 Effacement (%): 60 Cervical Position: Middle Station: -3 Presentation: Vertex Exam by:: Gaye AlkenL. Munford RN   MAU Course  Procedures  - SSE - Cervical exam + repeat - Wet prep - Fern test - FHT  Assessment and Plan   # Candidal vaginosis - tx fluconazole x1  # Term Pregnancy,  Latent labor - No  ROM - Continue to monitor FHT in MAU - Cat 1 - + Cervical change in 2 hrs - Admit to L&D for active management of labor - High Risk pregnancy w/ Prior PTL at 24 wks (twins, one survived) and concern for insulin resistance/ 1/3+  On 3hrGTT   Quincy Simmonds 06/08/2013, 12:59 AM   I have seen and examined this patient and agree with above documentation in the resident's note.  28 yo [id number] @ [redacted]w[redacted]d by 22wk [place] who presents for contractions and ?LOF.  Fern and spec negative.  Cervical change in 1.5 hours.  Will admit to L&D for labor. Plan to observe for now and if no change, start pitocin.  VTX by nurse and resident check.  GBS + will treat with PCN. EFW 8-8.5#. Cat I tracing.   Rulon Abide, M.D. [facility] Fellow 06/08/2013 2:22 AM

## 2013-06-09 ENCOUNTER — Encounter (HOSPITAL_COMMUNITY): Payer: Self-pay | Admitting: Obstetrics & Gynecology

## 2013-06-09 ENCOUNTER — Encounter: Payer: Medicaid Other | Admitting: Obstetrics and Gynecology

## 2013-06-09 ENCOUNTER — Encounter: Payer: Self-pay | Admitting: Nurse Practitioner

## 2013-06-09 LAB — CBC
HEMATOCRIT: 25.5 % — AB (ref 36.0–46.0)
HEMOGLOBIN: 8.6 g/dL — AB (ref 12.0–15.0)
MCH: 28 pg (ref 26.0–34.0)
MCHC: 33.7 g/dL (ref 30.0–36.0)
MCV: 83.1 fL (ref 78.0–100.0)
Platelets: 183 10*3/uL (ref 150–400)
RBC: 3.07 MIL/uL — AB (ref 3.87–5.11)
RDW: 14.2 % (ref 11.5–15.5)
WBC: 13.2 10*3/uL — ABNORMAL HIGH (ref 4.0–10.5)

## 2013-06-09 NOTE — Progress Notes (Signed)
Abd binder ordered.  Foley and IVF d/c'd. Attestation of Attending Supervision of Resident: Evaluation and management procedures were performed by the [facility] under my supervision.  I have seen and examined the patient, reviewed the resident's note and chart, and I agree with the management and plan.  Anibal Hendersonarolyn L Harraway-Smith, M.D. 06/09/2013 8:57 AM

## 2013-06-09 NOTE — Progress Notes (Signed)
UR completed 

## 2013-06-09 NOTE — Progress Notes (Signed)
Subjective: Postpartum Day #1: Cesarean Delivery Patient reports no complaints. Pain adequately controlled.  Good UOP w foley. Has not yet passed flatus.  Denies dizziness, blurry vision, headache.   Objective: Vital signs in last 24 hours: Temp:  [97.3 F (36.3 C)-99 F (37.2 C)] 98.1 F (36.7 C) (02/03 0625) Pulse Rate:  [72-104] 87 (02/03 0625) Resp:  [15-29] 16 (02/03 0625) BP: (61-136)/(26-98) 111/73 mmHg (02/03 0625) SpO2:  [96 %-100 %] 98 % (02/03 16100625)  Physical Exam:  General: alert, cooperative, appears stated age and no distress Lochia: appropriate Uterine Fundus: firm Incision: no significant drainage, no significant erythema DVT Evaluation: No evidence of DVT seen on physical exam. Negative Homan's sign. No cords or calf tenderness.   Recent Labs  06/08/13 0216 06/09/13 0555  HGB 12.1 8.6*  HCT 35.8* 25.5*    Assessment/Plan:  #POD1 - Status post primary Cesarean section for failure to descend. - Doing well postoperatively.  - Continue current care.  - Good UOP   # Decreased Hgb - asymptomatic  # Pre-E - pr/cr ratio 0.32.  BP wnl overnight. No mag. Continue to monitor  # MOC: Nexplanon # MOF: Bottle  Cheryl SimmondsFeeney, Cheryl Wilkerson 06/09/2013, 8:43 AM

## 2013-06-09 NOTE — Anesthesia Postprocedure Evaluation (Signed)
  Anesthesia Post-op Note  Patient: Cheryl Wilkerson  Procedure(s) Performed: Procedure(s): CESAREAN SECTION (N/A)  Patient Location: Mother/Baby  Anesthesia Type:Spinal  Level of Consciousness: awake, alert  and oriented  Airway and Oxygen Therapy: Patient Spontanous Breathing  Post-op Pain: none  Post-op Assessment: Post-op Vital signs reviewed, Patient's Cardiovascular Status Stable, Respiratory Function Stable, Pain level controlled, No headache, No backache, No residual numbness and No residual motor weakness  Post-op Vital Signs: Reviewed and stable  Complications: No apparent anesthesia complications

## 2013-06-10 MED ORDER — FERROUS GLUCONATE 324 (38 FE) MG PO TABS
324.0000 mg | ORAL_TABLET | Freq: Two times a day (BID) | ORAL | Status: DC
  Filled 2013-06-10 (×3): qty 1

## 2013-06-10 MED ORDER — DOCUSATE SODIUM 100 MG PO CAPS: 100.0000 mg | ORAL_CAPSULE | Freq: Two times a day (BID) | ORAL | Status: DC

## 2013-06-10 MED ORDER — OXYCODONE-ACETAMINOPHEN 5-325 MG PO TABS: 1.0000 | ORAL_TABLET | Freq: Four times a day (QID) | ORAL | Status: DC | PRN

## 2013-06-10 NOTE — Discharge Summary (Signed)
Attestation of Attending Supervision of Advanced Practitioner (CNM/NP): Evaluation and management procedures were performed by the Advanced Practitioner under my supervision and collaboration.  I have reviewed the Advanced Practitioner's note and chart, and I agree with the management and plan.  Worthington Cruzan 06/10/2013 2:42 PM   

## 2013-06-10 NOTE — Discharge Summary (Signed)
Obstetric Discharge Summary Reason for Admission: onset of labor Prenatal Procedures: none Intrapartum Procedures: cesarean: low cervical, transverse Postpartum Procedures: none Complications-Operative and Postpartum: none Hemoglobin  Date Value Range Status  06/09/2013 8.6* 12.0 - 15.0 g/dL Final     REPEATED TO VERIFY     DELTA CHECK NOTED     HCT  Date Value Range Status  06/09/2013 25.5* 36.0 - 46.0 % Final    Physical Exam:  General: alert, cooperative, appears stated age and no distress Lochia: appropriate Uterine Fundus: firm Incision: no significant drainage, no significant erythema DVT Evaluation: No evidence of DVT seen on physical exam. Negative Homan's sign. No cords or calf tenderness. No significant calf/ankle edema.  [facility]oses[facility]   Hospital Course: 28 y.o. [id number] presented at 40.1wks w/ Pre-E in  SOOL. No mag requirement as Pre-E was mild with Pr/Cr ratio of 0.32 and BP wnl this hospital course. Patient went to cesarean section for failure to progress. No complications post operatively. Good UOP. Tolerating PO. + flatus.   #POD2  - Status post primary Cesarean section for failure to descend.  - Doing well postoperatively.  - Continue current care.  - Good UOP   # Pre-E  - pr/cr ratio 0.32. BP wnl overnight. No mag. Follow-up BP outpatient.  # UTI  - enterococcus and e.coli - continue keflex  # MOC: Nexplanon  # MOF: Bottle     Discharge Information: Date: 06/10/2013 Activity: pelvic rest and No lifting more than 10 lbs Diet: routine Medications: PNV, Colace and Percocet Condition: stable Instructions: refer to practice specific booklet Discharge to: home Follow-up Information   [facility] Schedule an appointment as soon as possible for a visit in 2 weeks.   Specialty:  Obstetrics and Gynecology   Contact information:   [address] [place] [address] [phone]       Newborn Data: Live born female  Birth Weight: 9 lb 2.9 oz (4165 g) APGAR: 8, 9  Home with mother.  Quincy SimmondsFeeney, Patricia L 06/10/2013, 9:12 AM  I spoke with and examined patient and agree with resident's note and plan of care.  Tawana ScaleMichael Ryan Nashayla Telleria, MD OB Fellow 06/10/2013 10:24 AM

## 2013-06-10 NOTE — Discharge Instructions (Signed)
Postpartum Care After Cesarean Delivery °After you deliver your newborn (postpartum period), the usual stay in the hospital is 24 72 hours. If there were problems with your labor or delivery, or if you have other medical problems, you might be in the hospital longer.  °While you are in the hospital, you will receive help and instructions on how to care for yourself and your newborn during the postpartum period.  °While you are in the hospital: °· It is normal for you to have pain or discomfort from the incision in your abdomen. Be sure to tell your nurses when you are having pain, where the pain is located, and what makes the pain worse. °· If you are breastfeeding, you may feel uncomfortable contractions of your uterus for a couple of weeks. This is normal. The contractions help your uterus get back to normal size. °· It is normal to have some bleeding after delivery. °· For the first 1 3 days after delivery, the flow is red and the amount may be similar to a period. °· It is common for the flow to start and stop. °· In the first few days, you may pass some small clots. Let your nurses know if you begin to pass large clots or your flow increases. °· Do not  flush blood clots down the toilet before having the nurse look at them. °· During the next 3 10 days after delivery, your flow should become more watery and pink or brown-tinged in color. °· Ten to fourteen days after delivery, your flow should be a small amount of yellowish-white discharge. °· The amount of your flow will decrease over the first few weeks after delivery. Your flow may stop in 6 8 weeks. Most women have had their flow stop by 12 weeks after delivery. °· You should change your sanitary pads frequently. °· Wash your hands thoroughly with soap and water for at least 20 seconds after changing pads, using the toilet, or before holding or feeding your newborn. °· Your intravenous (IV) tubing will be removed when you are drinking enough fluids. °· The  urine drainage tube (urinary catheter) that was inserted before delivery may be removed within 6 8 hours after delivery or when feeling returns to your legs. You should feel like you need to empty your bladder within the first 6 8 hours after the catheter has been removed. °· In case you become weak, lightheaded, or faint, call your nurse before you get out of bed for the first time and before you take a shower for the first time. °· Within the first few days after delivery, your breasts may begin to feel tender and full. This is called engorgement. Breast tenderness usually goes away within 48 72 hours after engorgement occurs. You may also notice milk leaking from your breasts. If you are not breastfeeding, do not stimulate your breasts. Breast stimulation can make your breasts produce more milk. °· Spending as much time as possible with your newborn is very important. During this time, you and your newborn can feel close and get to know each other. Having your newborn stay in your room (rooming in) will help to strengthen the bond with your newborn. It will give you time to get to know your newborn and become comfortable caring for your newborn. °· Your hormones change after delivery. Sometimes the hormone changes can temporarily cause you to feel sad or tearful. These feelings should not last more than a few days. If these feelings last longer   than that, you should talk to your caregiver. °· If desired, talk to your caregiver about methods of family planning or contraception. °· Talk to your caregiver about immunizations. Your caregiver may want you to have the following immunizations before leaving the hospital: °· Tetanus, diphtheria, and pertussis (Tdap) or tetanus and diphtheria (Td) immunization. It is very important that you and your family (including grandparents) or others caring for your newborn are up-to-date with the Tdap or Td immunizations. The Tdap or Td immunization can help protect your newborn  from getting ill. °· Rubella immunization. °· Varicella (chickenpox) immunization. °· Influenza immunization. You should receive this annual immunization if you did not receive the immunization during your pregnancy. °Document Released: 01/16/2012 Document Reviewed: 01/16/2012 °ExitCare® Patient Information ©2014 [employer]. ° °

## 2013-07-17 ENCOUNTER — Encounter: Payer: Self-pay | Admitting: Nurse Practitioner

## 2013-07-17 ENCOUNTER — Ambulatory Visit (INDEPENDENT_AMBULATORY_CARE_PROVIDER_SITE_OTHER): Payer: Medicaid Other | Admitting: Nurse Practitioner

## 2013-07-17 VITALS — BP 131/81 | HR 85 | Temp 97.6°F | Wt 180.1 lb

## 2013-07-17 DIAGNOSIS — Z01812 Encounter for preprocedural laboratory examination: Secondary | ICD-10-CM

## 2013-07-17 DIAGNOSIS — Z30017 Encounter for initial prescription of implantable subdermal contraceptive: Secondary | ICD-10-CM

## 2013-07-17 DIAGNOSIS — Z309 Encounter for contraceptive management, unspecified: Secondary | ICD-10-CM | POA: Insufficient documentation

## 2013-07-17 LAB — POCT PREGNANCY, URINE: Preg Test, Ur: NEGATIVE

## 2013-07-17 MED ORDER — ETONOGESTREL 68 MG ~~LOC~~ IMPL
68.0000 mg | DRUG_IMPLANT | Freq: Once | SUBCUTANEOUS | Status: AC
  Administered 2013-07-17: 68 mg via SUBCUTANEOUS

## 2013-07-17 NOTE — Patient Instructions (Signed)
Etonogestrel implant What is this medicine? ETONOGESTREL (et oh noe JES trel) is a contraceptive (birth control) device. It is used to prevent pregnancy. It can be used for up to 3 years. This medicine may be used for other purposes; ask your health care provider or pharmacist if you have questions. COMMON BRAND NAME(S): Implanon, Nexplanon  What should I tell my health care provider before I take this medicine? They need to know if you have any of these conditions: -abnormal vaginal bleeding -blood vessel disease or blood clots -cancer of the breast, cervix, or liver -depression -diabetes -gallbladder disease -headaches -heart disease or recent heart attack -high blood pressure -high cholesterol -kidney disease -liver disease -renal disease -seizures -tobacco smoker -an unusual or allergic reaction to etonogestrel, other hormones, anesthetics or antiseptics, medicines, foods, dyes, or preservatives -pregnant or trying to get pregnant -breast-feeding How should I use this medicine? This device is inserted just under the skin on the inner side of your upper arm by a health care professional. Talk to your pediatrician regarding the use of this medicine in children. Special care may be needed. Overdosage: If you think you've taken too much of this medicine contact a poison control center or emergency room at once. Overdosage: If you think you have taken too much of this medicine contact a poison control center or emergency room at once. NOTE: This medicine is only for you. Do not share this medicine with others. What if I miss a dose? This does not apply. What may interact with this medicine? Do not take this medicine with any of the following medications: -amprenavir -bosentan -fosamprenavir This medicine may also interact with the following medications: -barbiturate medicines for inducing sleep or treating seizures -certain medicines for fungal infections like ketoconazole and  itraconazole -griseofulvin -medicines to treat seizures like carbamazepine, felbamate, oxcarbazepine, phenytoin, topiramate -modafinil -phenylbutazone -rifampin -some medicines to treat HIV infection like atazanavir, indinavir, lopinavir, nelfinavir, tipranavir, ritonavir -St. John's wort This list may not describe all possible interactions. Give your health care provider a list of all the medicines, herbs, non-prescription drugs, or dietary supplements you use. Also tell them if you smoke, drink alcohol, or use illegal drugs. Some items may interact with your medicine. What should I watch for while using this medicine? This product does not protect you against HIV infection (AIDS) or other sexually transmitted diseases. You should be able to feel the implant by pressing your fingertips over the skin where it was inserted. Tell your doctor if you cannot feel the implant. What side effects may I notice from receiving this medicine? Side effects that you should report to your doctor or health care professional as soon as possible: -allergic reactions like skin rash, itching or hives, swelling of the face, lips, or tongue -breast lumps -changes in vision -confusion, trouble speaking or understanding -dark urine -depressed mood -general ill feeling or flu-like symptoms -light-colored stools -loss of appetite, nausea -right upper belly pain -severe headaches -severe pain, swelling, or tenderness in the abdomen -shortness of breath, chest pain, swelling in a leg -signs of pregnancy -sudden numbness or weakness of the face, arm or leg -trouble walking, dizziness, loss of balance or coordination -unusual vaginal bleeding, discharge -unusually weak or tired -yellowing of the eyes or skin Side effects that usually do not require medical attention (Report these to your doctor or health care professional if they continue or are bothersome.): -acne -breast pain -changes in  weight -cough -fever or chills -headache -irregular menstrual bleeding -itching, burning,   and vaginal discharge -pain or difficulty passing urine -sore throat This list may not describe all possible side effects. Call your doctor for medical advice about side effects. You may report side effects to FDA at 1-800-FDA-1088. Where should I keep my medicine? This drug is given in a hospital or clinic and will not be stored at home. NOTE: This sheet is a summary. It may not cover all possible information. If you have questions about this medicine, talk to your doctor, pharmacist, or health care provider.  2014, Elsevier/Gold Standard. (2011-10-29 15:37:45)  

## 2013-07-17 NOTE — Progress Notes (Signed)
Patient ID: Cheryl Wilkerson, female   DOB: 05/04/1986, 28 y.o.   MRN: [id number] Subjective:     Cheryl Wilkerson is a 28 y.o. female who presents for a postpartum visit. She is 6 week postpartum following a low cervical transverse Cesarean section. I have fully reviewed the prenatal and intrapartum course. The delivery was at 40 gestational weeks. Outcome: primary cesarean section, low transverse incision. Anesthesia: epidural. Postpartum course has been uneventful. Baby's course has been uneventful. Baby is feeding by bottle - Good Start. Bleeding thin lochia. Bowel function is normal. Bladder function is normal. Patient is not sexually active. Contraception method is Nexplanon. Postpartum depression screening: negative.  The following portions of the patient's history were reviewed and updated as appropriate: allergies, current medications, past family history, past medical history, past social history and past surgical history.  Review of Systems Pertinent items are noted in HPI.   Objective:    BP 131/81  Pulse 85  Temp(Src) 97.6 F (36.4 C) (Oral)  Wt 180 lb 1.6 oz (81.693 kg)  Breastfeeding? No  General:  alert   Breasts:  inspection negative, no nipple discharge or bleeding, no masses or nodularity palpable  Lungs: clear to auscultation bilaterally  Heart:  regular rate and rhythm, S1, S2 normal, no murmur, click, rub or gallop  Abdomen: soft, non-tender; bowel sounds normal; no masses,  no organomegalysurgical incision well healed   Vulva:  not evaluated  Vagina: not evaluated  Cervix:  not examined  Corpus: normal  Adnexa:  normal adnexa  Rectal Exam: Not performed.        NEXPLANON INSERTION  Patient given informed consent, signed copy in the chart, time out was performed. Pregnancy test was negative Appropriate time out taken.  Patient's left arm was prepped and draped in the usual sterile fashion.. The ruler used to measure and mark insertion area.  Pt was prepped with alcohol swab and then  injected with 3 cc of  1% lidocaine with epinephrine.  Pt was prepped with betadine, Nexplanon removed form packaging,  Device confirmed in needle, then inserted full length of needle and withdrawn per handbook instructions.  Pt insertion site covered with sterile dressing.   Minimal blood loss.  Pt tolerated the procedure well.   Results for orders placed in visit on 07/17/13 (from the past 24 hour(s))  POCT PREGNANCY, URINE     Status: None   Collection Time    07/17/13 11:10 AM      Result Value Ref Range   Preg Test, Ur NEGATIVE  NEGATIVE     Assessment:     postpartum exam. Pap smear not done at today's visit.  Contraception Management Plan:    1. Contraception: Nexplanon 2.  3. Follow up in: 1 year or as needed.

## 2013-07-22 ENCOUNTER — Encounter: Payer: Self-pay | Admitting: *Deleted

## 2013-09-21 ENCOUNTER — Encounter: Payer: Self-pay | Admitting: Obstetrics & Gynecology

## 2014-01-01 ENCOUNTER — Encounter: Payer: Self-pay | Admitting: General Practice

## 2014-03-08 ENCOUNTER — Encounter: Payer: Self-pay | Admitting: Nurse Practitioner

## 2015-01-15 IMAGING — US US OB DETAIL+14 WK
1 series · 12 of 28 positions shown · non-contrast
Comparison: none

[Series 1: us ob detail +14 wk · 12 of 80 slices shown]
[im 3/80]
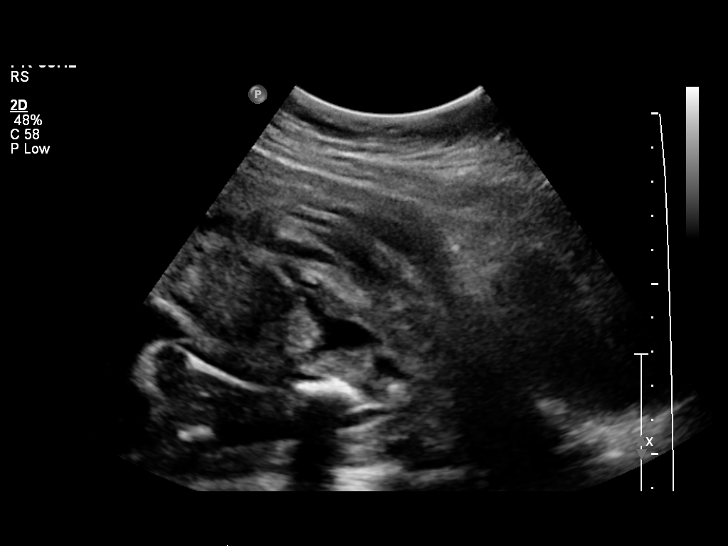
[im 9/80]
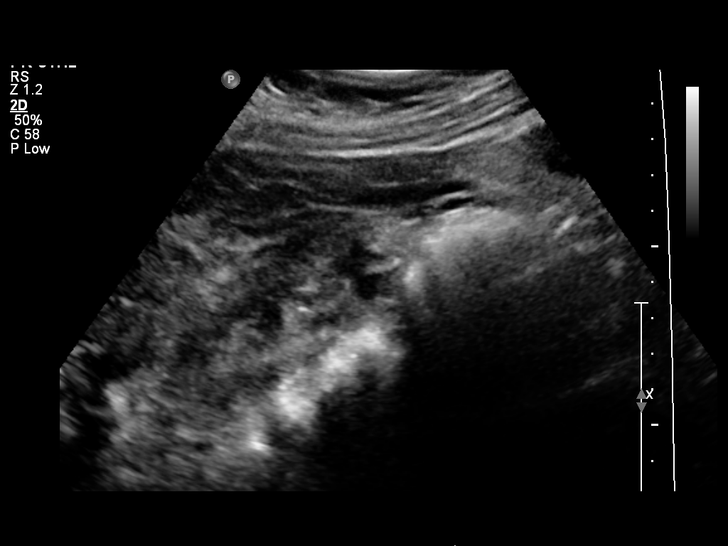
[im 15/80]
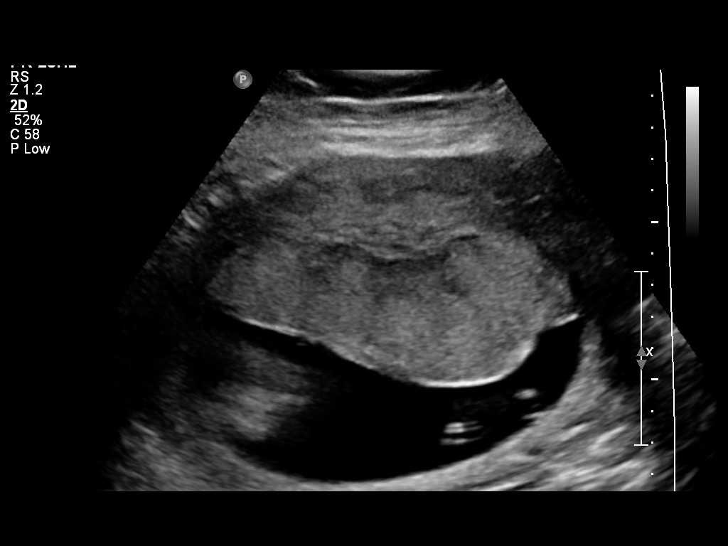
[im 24/80]
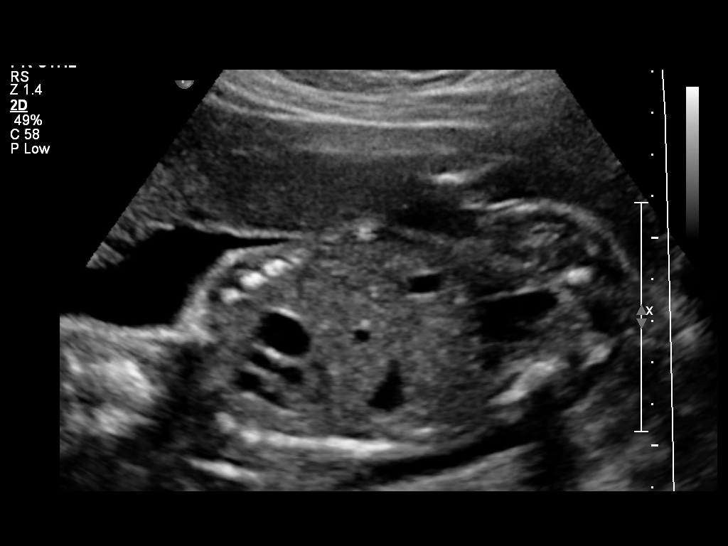
[im 30/80]
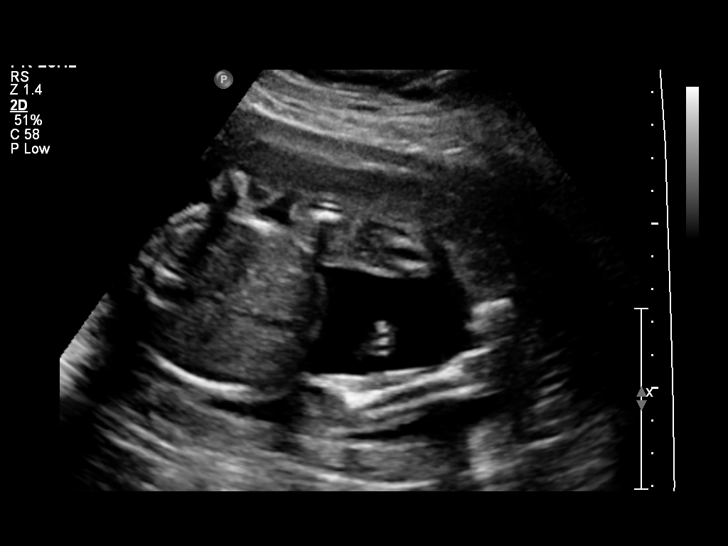
[im 36/80]
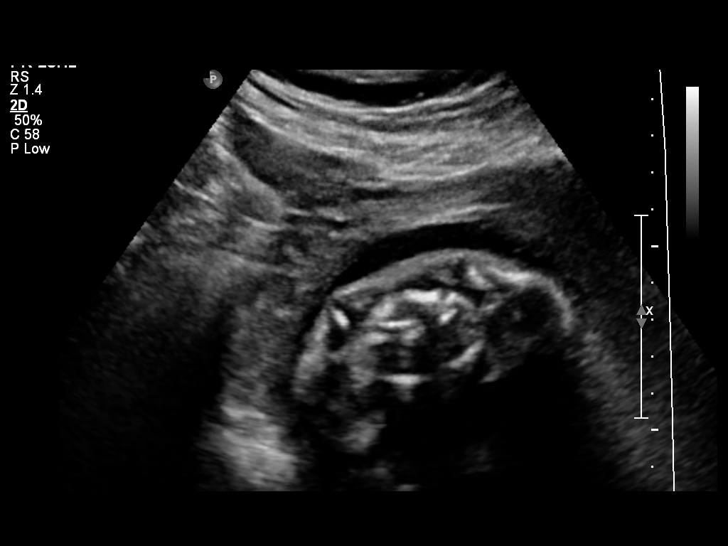
[im 44/80]
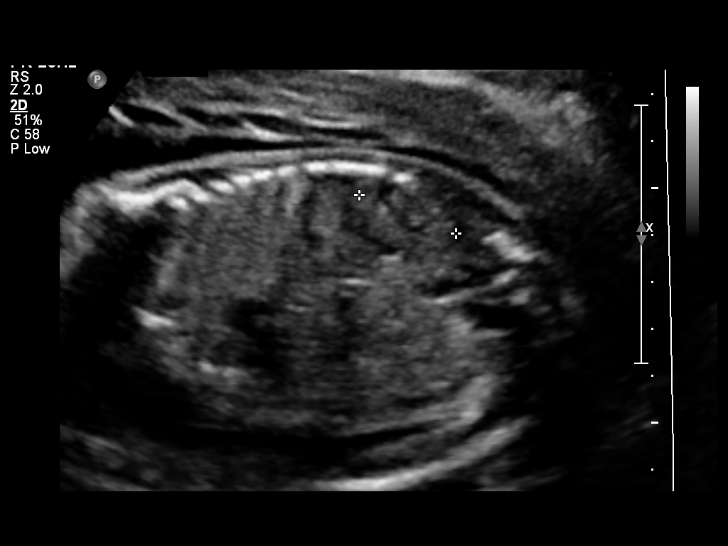
[im 50/80]
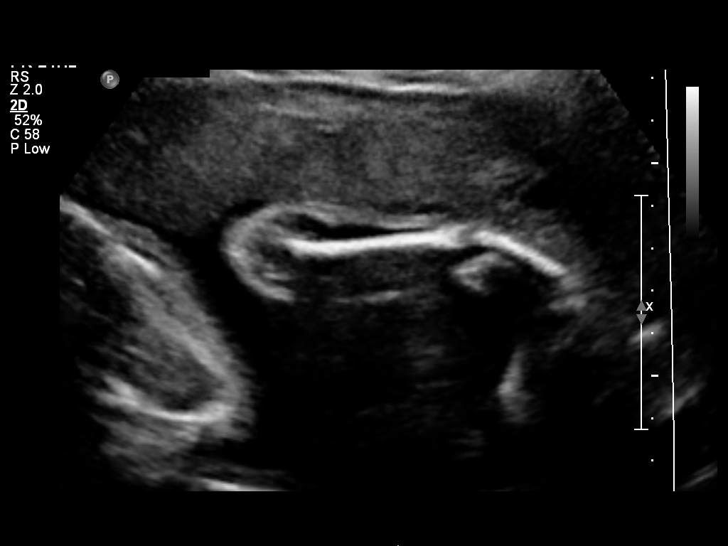
[im 56/80]
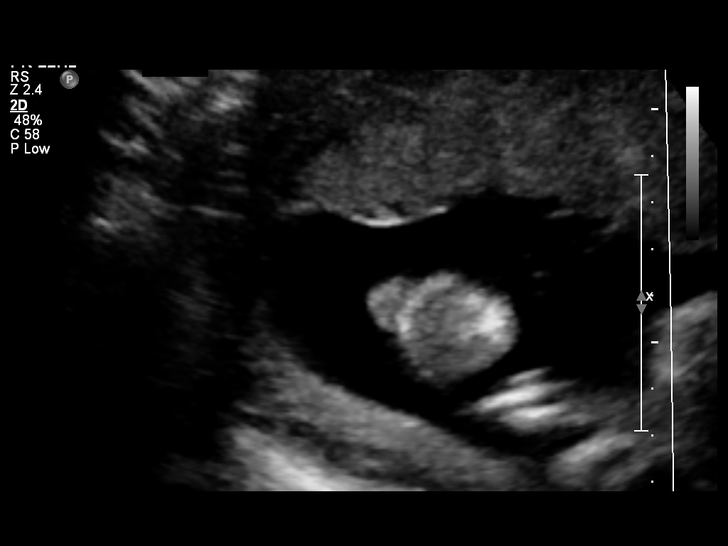
[im 65/80]
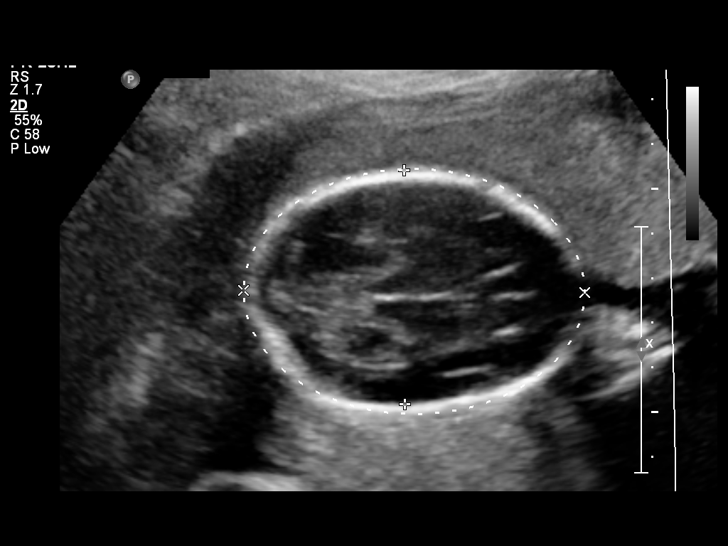
[im 71/80]
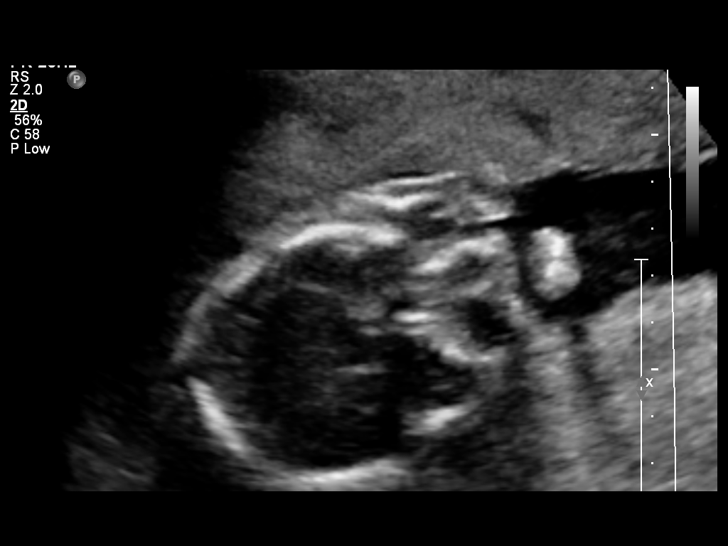
[im 77/80]
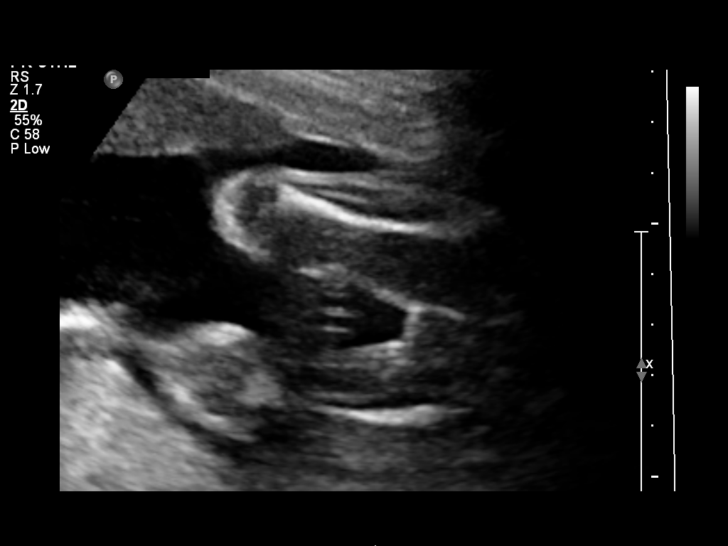

[12 of 28 positions shown; findings below may reference images not displayed]

OBSTETRICS REPORT
                      (Signed Final 02/16/2013 [DATE])

Service(s) Provided

 US OB DETAIL + 14 WK                                  76811.0
Indications

 Detailed fetal anatomic survey
 Poor obstetric history: Previous preterm delivery
 (24 weeks twins)
 No or Little Prenatal Care
Fetal Evaluation

 Num Of Fetuses:    1
 Fetal Heart Rate:  152                          bpm
 Cardiac Activity:  Observed
 Presentation:      Breech
 Placenta:          Anterior, above cervical os
 P. Cord            Visualized
 Insertion:

 Amniotic Fluid
 AFI FV:      Subjectively within normal limits
                                             Larg Pckt:     4.7  cm
Biometry

 BPD:     52.2  mm     G. Age:  21w 6d                CI:        64.86   70 - 86
                                                      FL/HC:      19.1   19.2 -

 HC:     208.3  mm     G. Age:  23w 0d       39  %    HC/AC:      1.09   1.05 -

 AC:     191.8  mm     G. Age:  23w 6d       75  %    FL/BPD:     76.2   71 - 87
 FL:      39.8  mm     G. Age:  22w 6d       38  %    FL/AC:      20.8   20 - 24
 HUM:     40.3  mm     G. Age:  24w 3d       81  %

 Est. FW:     582  gm      1 lb 5 oz     59  %
Gestational Age

 LMP:           24w 1d        Date:  08/31/12                 EDD:   06/07/13
 U/S Today:     22w 6d                                        EDD:   06/16/13
 Best:          22w 6d     Det. By:  U/S (02/16/13)           EDD:   06/16/13
Anatomy
 Cranium:          Appears normal         Aortic Arch:      Not well visualized
 Fetal Cavum:      Not well visualized    Ductal Arch:      Not well visualized
 Ventricles:       Appears normal         Diaphragm:        Appears normal
 Choroid Plexus:   Appears normal         Stomach:          Appears normal, left
                                                            sided
 Cerebellum:       Appears normal         Abdomen:          Appears normal
 Posterior Fossa:  Appears normal         Abdominal Wall:   Appears nml (cord
                                                            insert, abd wall)
 Nuchal Fold:      Not applicable (>20    Cord Vessels:     Appears normal (3
                   wks GA)                                  vessel cord)
 Face:             Appears normal         Kidneys:          Not well visualized
                   (orbits and profile)
 Lips:             Appears normal         Bladder:          Appears normal
 Heart:            Appears normal         Spine:            Appears normal
                   (4CH, axis, and
                   situs)
 RVOT:             Not well visualized    Lower             Appears normal
                                          Extremities:
 LVOT:             Not well visualized    Upper             Appears normal
                                          Extremities:

 Other:  Female gender.Left Heel visualized. Technically difficult due to fetal
         position.
Cervix Uterus Adnexa

 Cervical Length:    3.2      cm

 Cervix:       Normal appearance by transabdominal scan.

 Left Ovary:    Within normal limits.
 Right Ovary:   Not visualized.
 Adnexa:     No abnormality visualized.
Impression

 SUP at 22+6 weeks
 Normal detailed fetal anatomy; CSP, heart views and kidneys
 not optimally visualized
 Normal amniotic fluid volume
 EDC based on today's measurements
Recommendations

 Follow-up ultrasound in 4 weeks to complete anatomic survey
 and to assess fetal growth

 questions or concerns.

## 2015-03-09 IMAGING — US US OB FOLLOW-UP
1 series · 12 of 28 positions shown · non-contrast
Comparison: none

[Series 1: us ob follow up · 40 acquisitions, 12 frames shown]
[im 2/40]
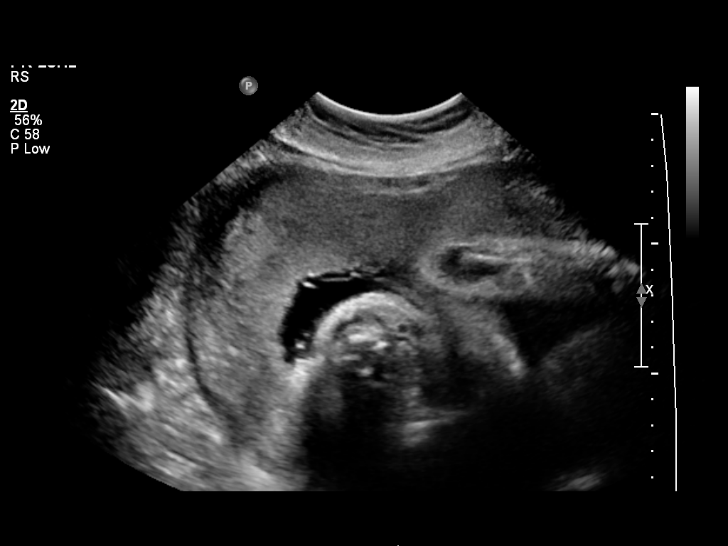
[im 5/40]
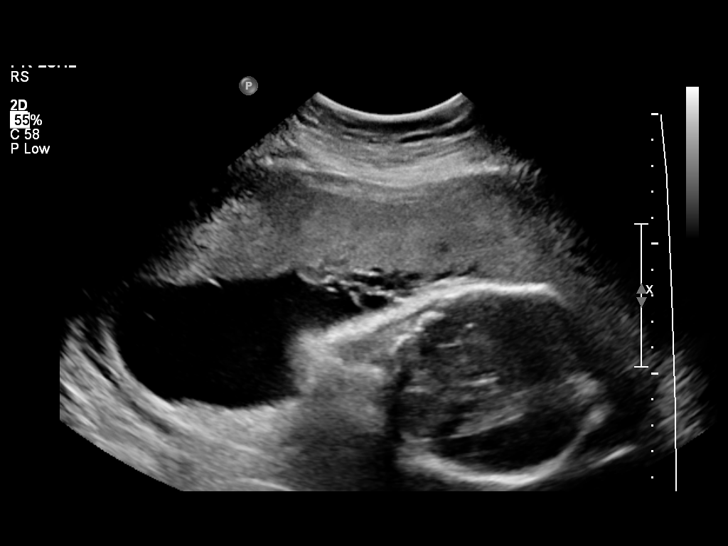
[im 8/40]
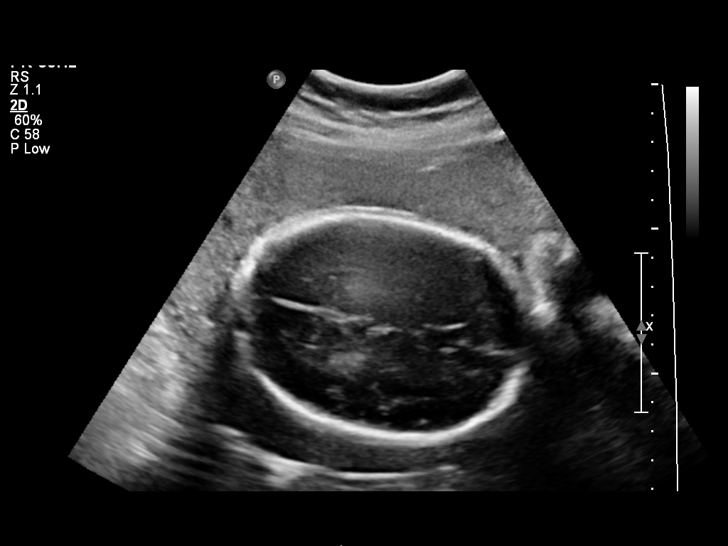
[im 12/40]
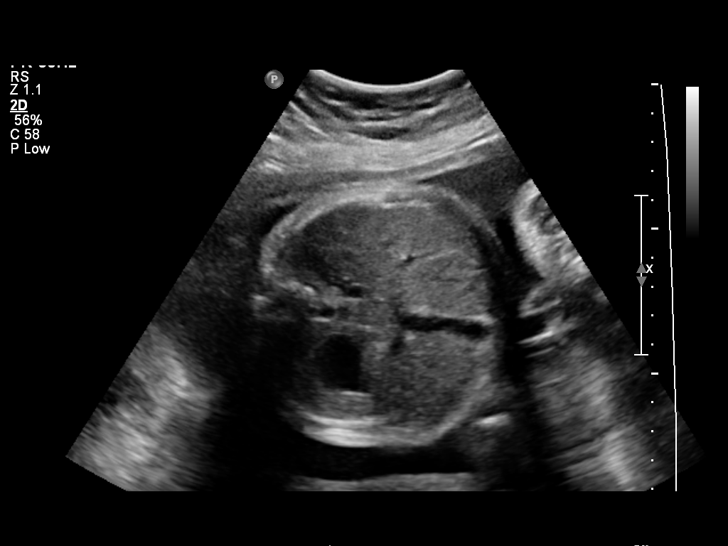
[im 15/40]
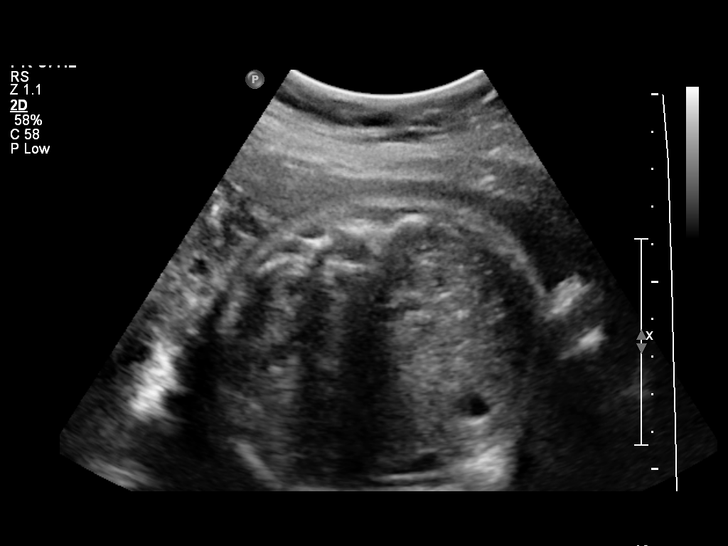
[im 18/40]
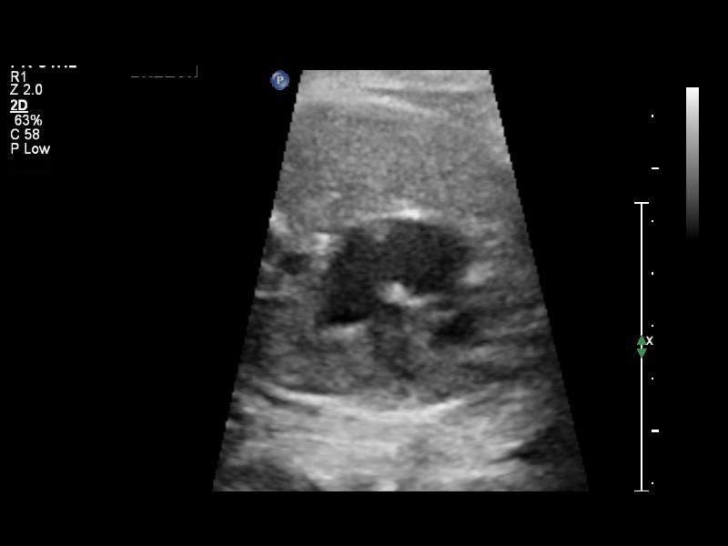
[im 22/40]
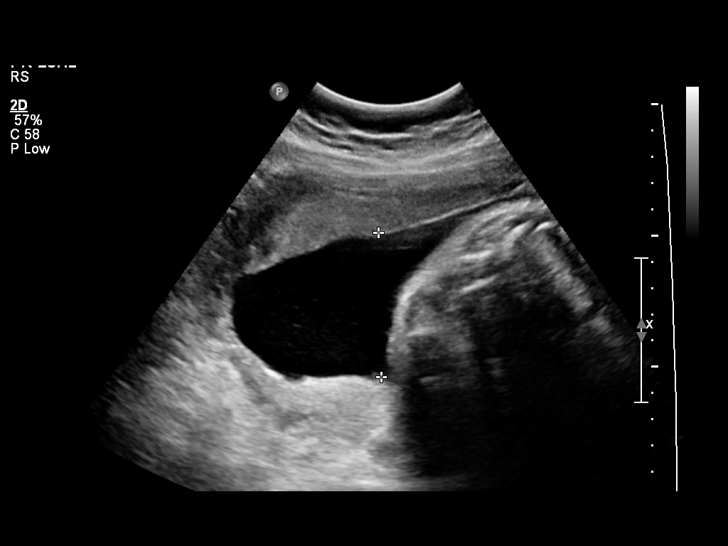
[im 25/40]
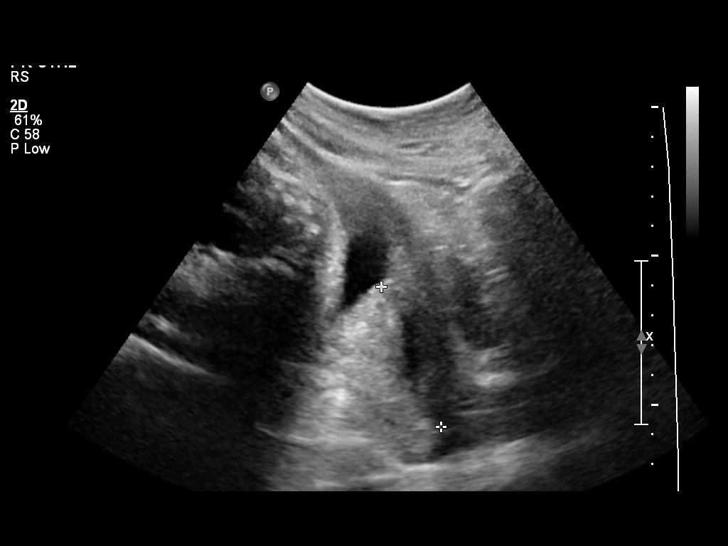
[im 28/40]
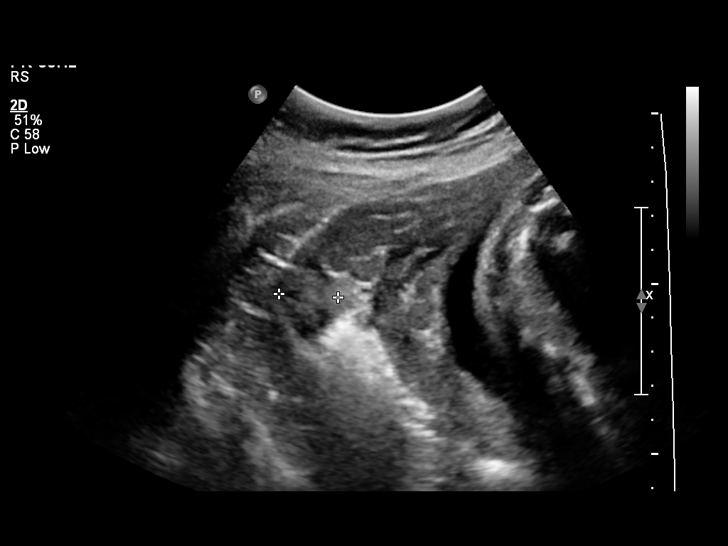
[im 32/40]
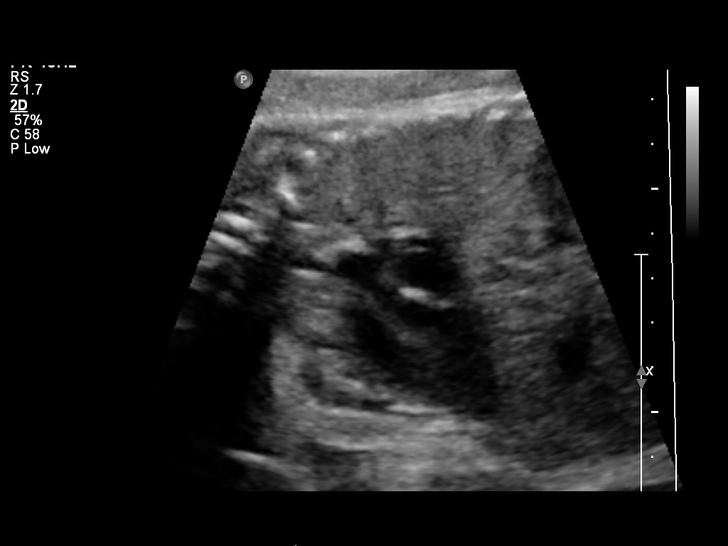
[im 35/40]
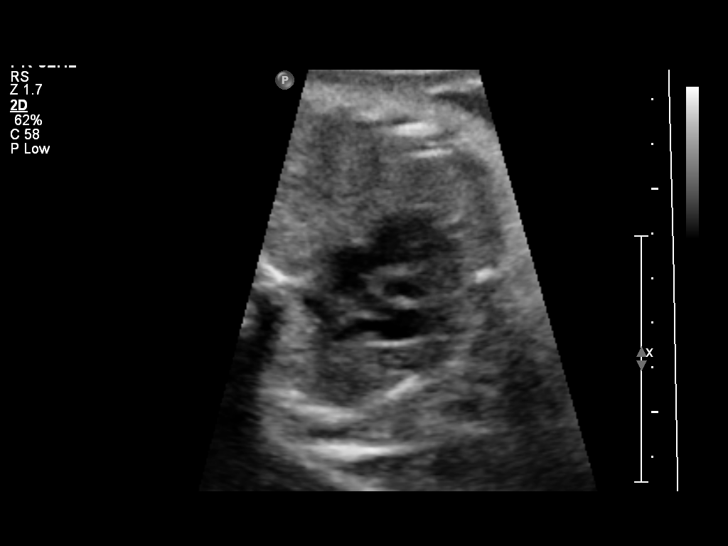
[im 38/40]
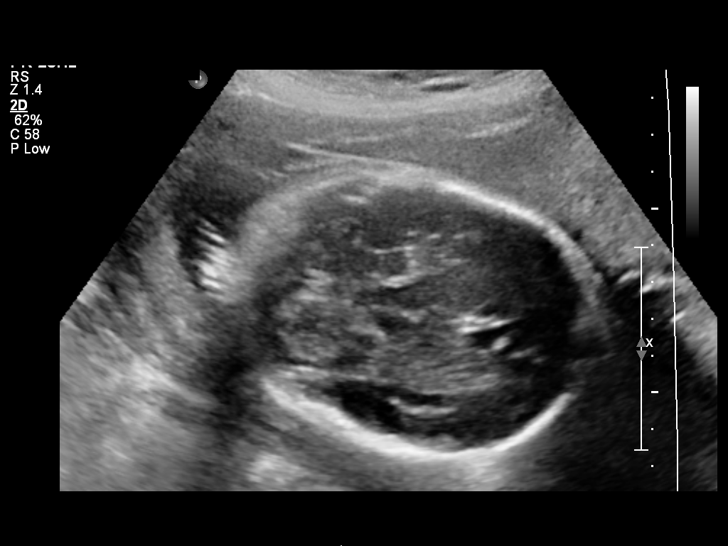

[12 of 28 positions shown; findings below may reference images not displayed]

OBSTETRICS REPORT
                      (Signed Final 04/13/2013 [DATE])

Service(s) Provided

 US OB FOLLOW UP                                       76816.1
Indications

 Poor obstetric history: Previous preterm delivery
 (24 weeks twins)
 No or Little Prenatal Care
 Follow-up incomplete fetal anatomic evaluation
Fetal Evaluation

 Num Of Fetuses:    1
 Fetal Heart Rate:  139                          bpm
 Cardiac Activity:  Observed
 Presentation:      Breech
 Placenta:          Fundal, above cervical os
 P. Cord            Visualized, central
 Insertion:

 Amniotic Fluid
 AFI FV:      Subjectively within normal limits
 AFI Sum:     18.21   cm       69  %Tile     Larg Pckt:    5.78  cm
 RUQ:   5.48    cm   RLQ:    2.72   cm    LUQ:   4.23    cm   LLQ:    5.78   cm
Biometry

 BPD:     74.1  mm     G. Age:  29w 5d                CI:        66.95   70 - 86
                                                      FL/HC:      19.9   19.2 -

 HC:     290.1  mm     G. Age:  31w 6d       58  %    HC/AC:      0.99   0.99 -

 AC:     292.7  mm     G. Age:  33w 2d     > 97  %    FL/BPD:     77.7   71 - 87
 FL:      57.6  mm     G. Age:  30w 1d       28  %    FL/AC:      19.7   20 - 24

 Est. FW:    6142  gm      4 lb 2 oz     78  %
Gestational Age

 LMP:           31w 5d        Date:  08/31/12                 EDD:   06/07/13
 U/S Today:     31w 2d                                        EDD:   06/10/13
 Best:          30w 3d     Det. By:  U/S (02/16/13)           EDD:   06/16/13
Anatomy
 Cranium:          Appears normal         Aortic Arch:      Previously seen
 Fetal Cavum:      Appears normal         Ductal Arch:      Previously seen
 Ventricles:       Appears normal         Diaphragm:        Previously seen
 Choroid Plexus:   Previously seen        Stomach:          Appears normal, left
                                                            sided
 Cerebellum:       Appears normal         Abdomen:          Appears normal
 Posterior Fossa:  Appears normal         Abdominal Wall:   Previously seen
 Nuchal Fold:      Not applicable (>20    Cord Vessels:     Previously seen
                   wks GA)
 Face:             Orbits and profile     Kidneys:          Appear normal
                   previously seen
 Lips:             Previously seen        Bladder:          Appears normal
 Heart:            Appears normal         Spine:            Previously seen
                   (4CH, axis, and
                   situs)
 RVOT:             Appears normal         Lower             Previously seen
                                          Extremities:
 LVOT:             Appears normal         Upper             Previously seen
                                          Extremities:

 Other:  Female gender.Left Heel previously visualized.
Cervix Uterus Adnexa

 Cervical Length:    5.1      cm

 Cervix:       Normal appearance by transabdominal scan.
 Uterus:       No abnormality visualized.

 Left Ovary:    Within normal limits.
 Right Ovary:   Within normal limits.
 Adnexa:     No abnormality visualized.
Impression

 IUP at 30+3 weeks
 Normal interval anatomy; anatomic survey complete
 Normal amniotic fluid volume
 Appropriate interval growth with EFW at the 78th %tile
Recommendations

 Follow-up as clinically indicated

 questions or concerns.

## 2017-02-18 ENCOUNTER — Ambulatory Visit (INDEPENDENT_AMBULATORY_CARE_PROVIDER_SITE_OTHER): Payer: BLUE CROSS/BLUE SHIELD | Admitting: Physician Assistant

## 2017-02-18 ENCOUNTER — Encounter (INDEPENDENT_AMBULATORY_CARE_PROVIDER_SITE_OTHER): Payer: Self-pay | Admitting: Physician Assistant

## 2017-02-18 VITALS — BP 123/86 | HR 76 | Temp 98.4°F | Wt 195.2 lb

## 2017-02-18 DIAGNOSIS — L709 Acne, unspecified: Secondary | ICD-10-CM

## 2017-02-18 DIAGNOSIS — L255 Unspecified contact dermatitis due to plants, except food: Secondary | ICD-10-CM | POA: Diagnosis not present

## 2017-02-18 MED ORDER — NORGESTIM-ETH ESTRAD TRIPHASIC 0.18/0.215/0.25 MG-35 MCG PO TABS: 1.0000 | ORAL_TABLET | Freq: Every day | ORAL | 11 refills | Status: DC

## 2017-02-18 MED ORDER — TRIAMCINOLONE ACETONIDE 0.5 % EX OINT: 1.0000 "application " | TOPICAL_OINTMENT | Freq: Two times a day (BID) | CUTANEOUS | 0 refills | Status: DC

## 2017-02-18 MED ORDER — TRETINOIN 0.05 % EX CREA: TOPICAL_CREAM | Freq: Every day | CUTANEOUS | 0 refills | Status: DC

## 2017-02-18 MED ORDER — SALICYLIC ACID 2 % EX LIQD: 1.0000 "application " | Freq: Every day | CUTANEOUS | 2 refills | Status: DC

## 2017-02-18 NOTE — Patient Instructions (Signed)
Acne Acne is a skin problem that causes pimples. Acne occurs when the pores in the skin get blocked. The pores may become infected with bacteria, or they may become red, sore, and swollen. Acne is a common skin problem, especially for teenagers. Acne usually goes away over time. What are the causes? Each pore contains an oil gland. Oil glands make an oily substance that is called sebum. Acne happens when these glands get plugged with sebum, dead skin cells, and dirt. Then, the bacteria that are normally found in the oil glands multiply and cause inflammation. Acne is commonly triggered by changes in your hormones. These hormonal changes can cause the oil glands to get bigger and to make more sebum. Factors that can make acne worse include:  Hormone changes during: ? Adolescence. ? Women's menstrual cycles. ? Pregnancy.  Oil-based cosmetics and hair products.  Harshly scrubbing the skin.  Strong soaps.  Stress.  Hormone problems that are due to certain diseases.  Long or oily hair rubbing against the skin.  Certain medicines.  Pressure from headbands, backpacks, or shoulder pads.  Exposure to certain oils and chemicals.  What increases the risk? This condition is more likely to develop in:  Teenagers.  People who have a family history of acne.  What are the signs or symptoms? Acne often occurs on the face, neck, chest, and upper back. Symptoms include:  Small, red bumps (pimples or papules).  Whiteheads.  Blackheads.  Small, pus-filled pimples (pustules).  Big, red pimples or pustules that feel tender.  More severe acne can cause:  An infected area that contains a collection of pus (abscess).  Hard, painful, fluid-filled sacs (cysts).  Scars.  How is this diagnosed? This condition is diagnosed with a medical history and physical exam. Blood tests may also be done. How is this treated? Treatment for this condition can vary depending on the severity of your  acne. Treatment may include:  Creams and lotions that prevent oil glands from clogging.  Creams and lotions that treat or prevent infections and inflammation.  Antibiotic medicines that are applied to the skin or taken as a pill.  Pills that decrease sebum production.  Birth control pills.  Light or laser treatments.  Surgery.  Injections of medicine into the affected areas.  Chemicals that cause peeling of the skin.  Your health care provider will also recommend the best way to take care of your skin. Good skin care is the most important part of treatment. Follow these instructions at home: Skin care Take care of your skin as told by your health care provider. You may be told to do these things:  Wash your skin gently at least two times each day, as well as: ? After you exercise. ? Before you go to bed.  Use mild soap.  Apply a water-based skin moisturizer after you wash your skin.  Use a sunscreen or sunblock with SPF 30 or greater. This is especially important if you are using acne medicines.  Choose cosmetics that will not plug your oil glands (are noncomedogenic).  Medicines  Take over-the-counter and prescription medicines only as told by your health care provider.  If you were prescribed an antibiotic medicine, apply or take it as told by your health care provider. Do not stop taking the antibiotic even if your condition improves. General instructions  Keep your hair clean and off of your face. If you have oily hair, shampoo your hair regularly or daily.  Avoid leaning your chin or   forehead against your hands.  Avoid wearing tight headbands or hats.  Avoid picking or squeezing your pimples. That can make your acne worse and cause scarring.  Keep all follow-up visits as told by your health care provider. This is important.  Shave gently and only when necessary.  Keep a food journal to figure out if any foods are linked with your acne. Contact a health  care provider if:  Your acne is not better after eight weeks.  Your acne gets worse.  You have a large area of skin that is red or tender.  You think that you are having side effects from any acne medicine. This information is not intended to replace advice given to you by your health care provider. Make sure you discuss any questions you have with your health care provider. Document Released: 04/20/2000 Document Revised: 12/23/2015 Document Reviewed: 06/30/2014 Elsevier Interactive Patient Education  2018 [employer]  

## 2017-02-18 NOTE — Progress Notes (Signed)
Subjective:  Patient ID: Cheryl Wilkerson, female    DOB: [birth year]  Age: 31 y.o. MRN: [id number]  CC: itching on arms  HPI Cheryl Wilkerson is a 31 y.o. female with no significant medical history presents with complaint of a few pruritic lesions on the arms after gardening. Also has moderately severe acne of the face. Has not taken any specific treatments to help with acne lesions. Has never used birth control for acne control. Denies hx of hypercoagulability, breast cancer, or smoking.        Outpatient Medications Prior to Visit  Medication Sig Dispense Refill  . docusate sodium (COLACE) 100 MG capsule Take 1 capsule (100 mg total) by mouth 2 (two) times daily. (Patient not taking: Reported on 02/18/2017) 10 capsule 0  . Prenatal Vit-Fe Fumarate-FA (PRENATAL MULTIVITAMIN) TABS tablet Take 1 tablet by mouth daily at 12 noon.     No facility-administered medications prior to visit.      ROS Review of Systems  Constitutional: Negative for chills, fever and malaise/fatigue.  Eyes: Negative for blurred vision.  Respiratory: Negative for shortness of breath.   Cardiovascular: Negative for chest pain and palpitations.  Gastrointestinal: Negative for abdominal pain and nausea.  Genitourinary: Negative for dysuria and hematuria.  Musculoskeletal: Negative for joint pain and myalgias.  Skin: Negative for rash.  Neurological: Negative for tingling and headaches.  Psychiatric/Behavioral: Negative for depression. The patient is not nervous/anxious.     Objective:  BP 123/86 (BP Location: Right Arm, Patient Position: Sitting, Cuff Size: Large)   Pulse 76   Temp 98.4 F (36.9 C) (Oral)   Wt 195 lb 3.2 oz (88.5 kg)   LMP 01/21/2017 (Exact Date)   SpO2 99%   BMI 32.99 kg/m   BP/Weight 02/18/2017 07/17/2013 06/10/2013  Systolic BP 123 131 125  Diastolic BP 86 81 81  Wt. (Lbs) 195.2 180.1 -  BMI 32.99 30.45 -      Physical Exam  Constitutional: She is oriented to person, place, and time.  Well  developed, overweight, NAD, polite  HENT:  Head: Normocephalic and atraumatic.  Eyes: No scleral icterus.  Cardiovascular: Normal rate, regular rhythm and normal heart sounds.   Pulmonary/Chest: Effort normal and breath sounds normal.  Musculoskeletal: She exhibits no edema.  Neurological: She is alert and oriented to person, place, and time. No cranial nerve deficit. Coordination normal.  Skin: Skin is warm and dry.  Few, small, rounded, erythematous, and finely scaled lesions on left forearm. Similar lesion with Koebnerization on right forearm. Multiple papulopustular lesions on face of varying sizes. Multiple hyperpigmented postinflammatory lesions on face.  Psychiatric: She has a normal mood and affect. Her behavior is normal. Thought content normal.  Vitals reviewed.    Assessment & Plan:    1. Plant dermatitis - Begin triamcinolone ointment (KENALOG) 0.5 %; Apply 1 application topically 2 (two) times daily.  Dispense: 30 g; Refill: 0  2. Acne, unspecified acne type - Begin Norgestimate-Ethinyl Estradiol Triphasic (ORTHO TRI-CYCLEN, 28,) 0.18/0.215/0.25 MG-35 MCG tablet; Take 1 tablet by mouth daily.  Dispense: 1 Package; Refill: 11 - Begin tretinoin (RETIN-A) 0.05 % cream; Apply topically at bedtime.  Dispense: 45 g; Refill: 0 - Begin Salicylic Acid 2 % LIQD; Apply 1 application topically daily.  Dispense: 1 Bottle; Refill: 2   Meds ordered this encounter  Medications  . Norgestimate-Ethinyl Estradiol Triphasic (ORTHO TRI-CYCLEN, 28,) 0.18/0.215/0.25 MG-35 MCG tablet    Sig: Take 1 tablet by mouth daily.    Dispense:  1 Package  Refill:  11    Order Specific Question:   Supervising Provider    Answer:   Quentin Angst [id number]  . tretinoin (RETIN-A) 0.05 % cream    Sig: Apply topically at bedtime.    Dispense:  45 g    Refill:  0    Order Specific Question:   Supervising Provider    Answer:   Quentin Angst [id number]  . Salicylic Acid 2 % LIQD    Sig:  Apply 1 application topically daily.    Dispense:  1 Bottle    Refill:  2    Order Specific Question:   Supervising Provider    Answer:   Quentin Angst [id number]  . triamcinolone ointment (KENALOG) 0.5 %    Sig: Apply 1 application topically 2 (two) times daily.    Dispense:  30 g    Refill:  0    Order Specific Question:   Supervising Provider    Answer:   Quentin Angst [[id number]]    Follow-up: Return in about 8 weeks (around 04/15/2017) for acne.   Loletta Specter PA

## 2017-04-15 ENCOUNTER — Ambulatory Visit (INDEPENDENT_AMBULATORY_CARE_PROVIDER_SITE_OTHER): Payer: BLUE CROSS/BLUE SHIELD | Admitting: Physician Assistant

## 2017-05-01 ENCOUNTER — Encounter (INDEPENDENT_AMBULATORY_CARE_PROVIDER_SITE_OTHER): Payer: Self-pay | Admitting: Physician Assistant

## 2017-05-01 ENCOUNTER — Other Ambulatory Visit (HOSPITAL_COMMUNITY)
Admission: RE | Admit: 2017-05-01 | Discharge: 2017-05-01 | Disposition: A | Discharge status: Home / Self Care | Payer: BLUE CROSS/BLUE SHIELD | Source: Ambulatory Visit | Attending: Physician Assistant | Admitting: Physician Assistant

## 2017-05-01 ENCOUNTER — Ambulatory Visit (INDEPENDENT_AMBULATORY_CARE_PROVIDER_SITE_OTHER): Payer: BLUE CROSS/BLUE SHIELD | Admitting: Physician Assistant

## 2017-05-01 VITALS — BP 120/84 | HR 78 | Temp 99.0°F | Resp 18 | Ht 65.0 in | Wt 198.0 lb

## 2017-05-01 DIAGNOSIS — Z23 Encounter for immunization: Secondary | ICD-10-CM | POA: Diagnosis not present

## 2017-05-01 DIAGNOSIS — Z0000 Encounter for general adult medical examination without abnormal findings: Secondary | ICD-10-CM

## 2017-05-01 DIAGNOSIS — Z124 Encounter for screening for malignant neoplasm of cervix: Secondary | ICD-10-CM

## 2017-05-01 NOTE — Patient Instructions (Signed)

## 2017-05-01 NOTE — Progress Notes (Signed)
   Subjective:  Patient ID: Cheryl Wilkerson, female    DOB: 01/11/1986  Age: 31 y.o. MRN: [id number]  CC: establish care  HPI Cheryl Wilkerson is a 31 y.o. female with no significant medical history presents as a new patient to establish care.  Does not have any complaints. Not sexually active. Has not had a PAP done in many years. Would like to have flu vaccine administered today.           Outpatient Medications Prior to Visit  Medication Sig Dispense Refill  . Norgestimate-Ethinyl Estradiol Triphasic (ORTHO TRI-CYCLEN, 28,) 0.18/0.215/0.25 MG-35 MCG tablet Take 1 tablet by mouth daily. 1 Package 11  . Salicylic Acid 2 % LIQD Apply 1 application topically daily. 1 Bottle 2  . tretinoin (RETIN-A) 0.05 % cream Apply topically at bedtime. 45 g 0  . triamcinolone ointment (KENALOG) 0.5 % Apply 1 application topically 2 (two) times daily. 30 g 0   No facility-administered medications prior to visit.      ROS Review of Systems  Constitutional: Negative for chills, fever and malaise/fatigue.  Eyes: Negative for blurred vision.  Respiratory: Negative for shortness of breath.   Cardiovascular: Negative for chest pain and palpitations.  Gastrointestinal: Negative for abdominal pain and nausea.  Genitourinary: Negative for dysuria and hematuria.  Musculoskeletal: Negative for joint pain and myalgias.  Skin: Negative for rash.  Neurological: Negative for tingling and headaches.  Psychiatric/Behavioral: Negative for depression. The patient is not nervous/anxious.     Objective:  There were no vitals taken for this visit.  BP/Weight 02/18/2017 07/17/2013 06/10/2013  Systolic BP 123 131 125  Diastolic BP 86 81 81  Wt. (Lbs) 195.2 180.1 -  BMI 32.99 30.45 -      Physical Exam  Constitutional: She is oriented to person, place, and time.  Well developed, well nourished, NAD, polite  HENT:  Head: Normocephalic and atraumatic.  Eyes: No scleral icterus.  Neck: Normal range of motion. Neck supple. No  thyromegaly present.  Cardiovascular: Normal rate, regular rhythm and normal heart sounds.  Pulmonary/Chest: Effort normal and breath sounds normal.  Abdominal: Soft. Bowel sounds are normal. There is no tenderness.  Musculoskeletal: She exhibits no edema.  Neurological: She is alert and oriented to person, place, and time.  Skin: Skin is warm and dry. No rash noted. No erythema. No pallor.  Psychiatric: She has a normal mood and affect. Her behavior is normal. Thought content normal.  Vitals reviewed.    Assessment & Plan:   1. Annual physical exam - CBC with Differential - Comprehensive metabolic panel - Lipid panel  2. Screening for cervical cancer - Cytology - PAP [place]  3. Need for prophylactic vaccination and inoculation against influenza - Flu Vaccine QUAD 6+ mos PF IM (Fluarix Quad PF)     Follow-up: PRN   Loletta Specteroger David Gomez PA

## 2017-05-02 LAB — CBC WITH DIFFERENTIAL/PLATELET
Basophils Absolute: 0 10*3/uL (ref 0.0–0.2)
Basos: 0 %
EOS (ABSOLUTE): 0.1 10*3/uL (ref 0.0–0.4)
EOS: 1 %
HEMATOCRIT: 38.5 % (ref 34.0–46.6)
HEMOGLOBIN: 12.3 g/dL (ref 11.1–15.9)
Immature Grans (Abs): 0 10*3/uL (ref 0.0–0.1)
Immature Granulocytes: 0 %
Lymphocytes Absolute: 3.3 10*3/uL — ABNORMAL HIGH (ref 0.7–3.1)
Lymphs: 42 %
MCH: 26.2 pg — AB (ref 26.6–33.0)
MCHC: 31.9 g/dL (ref 31.5–35.7)
MCV: 82 fL (ref 79–97)
MONOCYTES: 6 %
Monocytes Absolute: 0.5 10*3/uL (ref 0.1–0.9)
Neutrophils Absolute: 4 10*3/uL (ref 1.4–7.0)
Neutrophils: 51 %
Platelets: 237 10*3/uL (ref 150–379)
RBC: 4.7 x10E6/uL (ref 3.77–5.28)
RDW: 14.2 % (ref 12.3–15.4)
WBC: 7.9 10*3/uL (ref 3.4–10.8)

## 2017-05-02 LAB — COMPREHENSIVE METABOLIC PANEL
ALBUMIN: 3.9 g/dL (ref 3.5–5.5)
ALK PHOS: 70 IU/L (ref 39–117)
ALT: 16 IU/L (ref 0–32)
AST: 16 IU/L (ref 0–40)
Albumin/Globulin Ratio: 1.1 — ABNORMAL LOW (ref 1.2–2.2)
BUN / CREAT RATIO: 12 (ref 9–23)
BUN: 11 mg/dL (ref 6–20)
CO2: 19 mmol/L — AB (ref 20–29)
CREATININE: 0.89 mg/dL (ref 0.57–1.00)
Calcium: 9.2 mg/dL (ref 8.7–10.2)
Chloride: 106 mmol/L (ref 96–106)
GFR calc Af Amer: 99 mL/min/{1.73_m2} (ref >59)
GFR calc non Af Amer: 86 mL/min/{1.73_m2} (ref >59)
GLOBULIN, TOTAL: 3.7 g/dL (ref 1.5–4.5)
Glucose: 158 mg/dL — ABNORMAL HIGH (ref 65–99)
Potassium: 3.8 mmol/L (ref 3.5–5.2)
Sodium: 140 mmol/L (ref 134–144)
Total Protein: 7.6 g/dL (ref 6.0–8.5)

## 2017-05-02 LAB — LIPID PANEL
CHOLESTEROL TOTAL: 211 mg/dL — AB (ref 100–199)
Chol/HDL Ratio: 4.3 ratio (ref 0.0–4.4)
HDL: 49 mg/dL (ref >39)
LDL Calculated: 105 mg/dL — ABNORMAL HIGH (ref 0–99)
Triglycerides: 287 mg/dL — ABNORMAL HIGH (ref 0–149)
VLDL Cholesterol Cal: 57 mg/dL — ABNORMAL HIGH (ref 5–40)

## 2017-05-03 ENCOUNTER — Telehealth (INDEPENDENT_AMBULATORY_CARE_PROVIDER_SITE_OTHER): Payer: Self-pay

## 2017-05-03 ENCOUNTER — Other Ambulatory Visit (INDEPENDENT_AMBULATORY_CARE_PROVIDER_SITE_OTHER): Payer: Self-pay | Admitting: Physician Assistant

## 2017-05-03 DIAGNOSIS — E7841 Elevated Lipoprotein(a): Secondary | ICD-10-CM

## 2017-05-03 DIAGNOSIS — E781 Pure hyperglyceridemia: Secondary | ICD-10-CM

## 2017-05-03 MED ORDER — ATORVASTATIN CALCIUM 40 MG PO TABS: 40.0000 mg | ORAL_TABLET | Freq: Every day | ORAL | 3 refills | Status: AC

## 2017-05-03 NOTE — Telephone Encounter (Signed)
Patient sister will give results to patient and then call to schedule appointment. Maryjean Mornempestt S Roberts, CMA

## 2017-05-03 NOTE — Telephone Encounter (Signed)
-----   Message from Loletta Specteroger David Gomez, PA-C sent at 05/03/2017 12:31 PM EST ----- Patient should return to have A1c done. Put her on my schedule. Also, her LDL and triglycerides are elevated. I have sent atorvastatin 40 mg to her [facility]. Also, please advise to not become pregnant while on atorvastatin.

## 2017-05-06 LAB — CYTOLOGY - PAP
BACTERIAL VAGINITIS: NEGATIVE
CANDIDA VAGINITIS: NEGATIVE
CHLAMYDIA, DNA PROBE: NEGATIVE
Diagnosis: NEGATIVE
Neisseria Gonorrhea: NEGATIVE
TRICH (WINDOWPATH): NEGATIVE

## 2017-05-08 ENCOUNTER — Encounter (INDEPENDENT_AMBULATORY_CARE_PROVIDER_SITE_OTHER): Payer: Self-pay

## 2017-05-08 ENCOUNTER — Other Ambulatory Visit (INDEPENDENT_AMBULATORY_CARE_PROVIDER_SITE_OTHER): Payer: BLUE CROSS/BLUE SHIELD

## 2017-05-08 DIAGNOSIS — Z131 Encounter for screening for diabetes mellitus: Secondary | ICD-10-CM | POA: Diagnosis not present

## 2017-05-08 DIAGNOSIS — R7303 Prediabetes: Secondary | ICD-10-CM

## 2017-05-08 LAB — POCT GLYCOSYLATED HEMOGLOBIN (HGB A1C): Hemoglobin A1C: 6.2

## 2017-05-08 MED ORDER — METFORMIN HCL 500 MG PO TABS: 500.0000 mg | ORAL_TABLET | Freq: Two times a day (BID) | ORAL | 3 refills | Status: AC

## 2017-05-08 NOTE — Progress Notes (Signed)
   Subjective:  Patient ID: Cheryl Wilkerson, female    DOB: 03/30/1986  Age: 32 y.o. MRN: [id number]  CC: Lab only   HPI Cheryl Wilkerson is a 32 y.o. female with no significant medical history presents to have A1c done after having been found to have a mildly elevated glucose on CMP.       Outpatient Medications Prior to Visit  Medication Sig Dispense Refill  . atorvastatin (LIPITOR) 40 MG tablet Take 1 tablet (40 mg total) by mouth daily. 90 tablet 3  . Norgestimate-Ethinyl Estradiol Triphasic (ORTHO TRI-CYCLEN, 28,) 0.18/0.215/0.25 MG-35 MCG tablet Take 1 tablet by mouth daily. 1 Package 11  . Salicylic Acid 2 % LIQD Apply 1 application topically daily. 1 Bottle 2  . tretinoin (RETIN-A) 0.05 % cream Apply topically at bedtime. 45 g 0  . triamcinolone ointment (KENALOG) 0.5 % Apply 1 application topically 2 (two) times daily. 30 g 0   No facility-administered medications prior to visit.      ROS Review of Systems  Constitutional: Negative for chills, fever and malaise/fatigue.  Eyes: Negative for blurred vision.  Respiratory: Negative for shortness of breath.   Cardiovascular: Negative for chest pain and palpitations.  Gastrointestinal: Negative for abdominal pain and nausea.  Genitourinary: Negative for dysuria and hematuria.  Musculoskeletal: Negative for joint pain and myalgias.  Skin: Negative for rash.       acne  Neurological: Negative for tingling and headaches.  Psychiatric/Behavioral: Negative for depression. The patient is not nervous/anxious.     Objective:  There were no vitals taken for this visit.  BP/Weight 05/01/2017 02/18/2017 07/17/2013  Systolic BP 120 123 131  Diastolic BP 84 86 81  Wt. (Lbs) 198 195.2 180.1  BMI 32.95 32.99 30.45      Physical Exam  Constitutional: She is oriented to person, place, and time.  Well developed, overweight, NAD, polite  HENT:  Head: Normocephalic and atraumatic.  Eyes: No scleral icterus.  Neck: Normal range of motion. Neck  supple. No thyromegaly present.  Neurological: She is alert and oriented to person, place, and time.  Skin: Skin is warm and dry. No rash noted. No erythema. No pallor.  Multiple acne lesions  Psychiatric: She has a normal mood and affect. Her behavior is normal. Thought content normal.  Vitals reviewed.    Assessment & Plan:   1. Prediabetes - Begin metFORMIN (GLUCOPHAGE) 500 MG tablet; Take 1 tablet (500 mg total) by mouth 2 (two) times daily with a meal.  Dispense: 180 tablet; Refill: 3 - Pt counseled on diet and exercise. Says she has recently begun to exercise but would like to start Metformin regardless.   2. Screening for diabetes mellitus - HgB A1c 6.2% in clinic today.   Meds ordered this encounter  Medications  . metFORMIN (GLUCOPHAGE) 500 MG tablet    Sig: Take 1 tablet (500 mg total) by mouth 2 (two) times daily with a meal.    Dispense:  180 tablet    Refill:  3    Order Specific Question:   Supervising Provider    Answer:   Quentin AngstJEGEDE, OLUGBEMIGA E [id number]    Follow-up: Return in about 6 months (around 11/05/2017) for prediabetes.   [facility]

## 2017-05-08 NOTE — Patient Instructions (Signed)
Prediabetes Prediabetes is the condition of having a blood sugar (blood glucose) level that is higher than it should be, but not high enough for you to be diagnosed with type 2 diabetes. Having prediabetes puts you at risk for developing type 2 diabetes (type 2 diabetes mellitus). Prediabetes may be called impaired glucose tolerance or impaired fasting glucose. Prediabetes usually does not cause symptoms. Your health care provider can diagnose this condition with blood tests. You may be tested for prediabetes if you are overweight and if you have at least one other risk factor for prediabetes. Risk factors for prediabetes include:  Having a family member with type 2 diabetes.  Being overweight or obese.  Being older than age 45.  Being of American-Indian, African-American, Hispanic/Latino, or Asian/Pacific Islander descent.  Having an inactive (sedentary) lifestyle.  Having a history of gestational diabetes or polycystic ovarian syndrome (PCOS).  Having low levels of good cholesterol (HDL-C) or high levels of blood fats (triglycerides).  Having high blood pressure.  What is blood glucose and how is blood glucose measured?  Blood glucose refers to the amount of glucose in your bloodstream. Glucose comes from eating foods that contain sugars and starches (carbohydrates) that the body breaks down into glucose. Your blood glucose level may be measured in mg/dL (milligrams per deciliter) or mmol/L (millimoles per liter).Your blood glucose may be checked with one or more of the following blood tests:  A fasting blood glucose (FBG) test. You will not be allowed to eat (you will fast) for at least 8 hours before a blood sample is taken. ? A normal range for FBG is 70-100 mg/dl (3.9-5.6 mmol/L).  An A1c (hemoglobin A1c) blood test. This test provides information about blood glucose control over the previous 2?3months.  An oral glucose tolerance test (OGTT). This test measures your blood  glucose twice: ? After fasting. This is your baseline level. ? Two hours after you drink a beverage that contains glucose.  You may be diagnosed with prediabetes:  If your FBG is 100?125 mg/dL (5.6-6.9 mmol/L).  If your A1c level is 5.7?6.4%.  If your OGGT result is 140?199 mg/dL (7.8-11 mmol/L).  These blood tests may be repeated to confirm your diagnosis. What happens if blood glucose is too high? The pancreas produces a hormone (insulin) that helps move glucose from the bloodstream into cells. When cells in the body do not respond properly to insulin that the body makes (insulin resistance), excess glucose builds up in the blood instead of going into cells. As a result, high blood glucose (hyperglycemia) can develop, which can cause many complications. This is a symptom of prediabetes. What can happen if blood glucose stays higher than normal for a long time? Having high blood glucose for a long time is dangerous. Too much glucose in your blood can damage your nerves and blood vessels. Long-term damage can lead to complications from diabetes, which may include:  Heart disease.  Stroke.  Blindness.  Kidney disease.  Depression.  Poor circulation in the feet and legs, which could lead to surgical removal (amputation) in severe cases.  How can prediabetes be prevented from turning into type 2 diabetes?  To help prevent type 2 diabetes, take the following actions:  Be physically active. ? Do moderate-intensity physical activity for at least 30 minutes on at least 5 days of the week, or as much as told by your health care provider. This could be brisk walking, biking, or water aerobics. ? Ask your health care provider what   activities are safe for you. A mix of physical activities may be best, such as walking, swimming, cycling, and strength training.  Lose weight as told by your health care provider. ? Losing 5-7% of your body weight can reverse insulin resistance. ? Your health  care provider can determine how much weight loss is best for you and can help you lose weight safely.  Follow a healthy meal plan. This includes eating lean proteins, complex carbohydrates, fresh fruits and vegetables, low-fat dairy products, and healthy fats. ? Follow instructions from your health care provider about eating or drinking restrictions. ? Make an appointment to see a diet and nutrition specialist (registered dietitian) to help you create a healthy eating plan that is right for you.  Do not smoke or use any tobacco products, such as cigarettes, chewing tobacco, and e-cigarettes. If you need help quitting, ask your health care provider.  Take over-the-counter and prescription medicines as told by your health care provider. You may be prescribed medicines that help lower the risk of type 2 diabetes.  This information is not intended to replace advice given to you by your health care provider. Make sure you discuss any questions you have with your health care provider. Document Released: 08/15/2015 Document Revised: 09/29/2015 Document Reviewed: 06/14/2015 Elsevier Interactive Patient Education  2018 Elsevier Inc.  

## 2017-10-25 ENCOUNTER — Ambulatory Visit (HOSPITAL_COMMUNITY)
Admission: EM | Admit: 2017-10-25 | Discharge: 2017-10-25 | Disposition: A | Discharge status: Home / Self Care | Payer: BLUE CROSS/BLUE SHIELD | Attending: Internal Medicine | Admitting: Internal Medicine

## 2017-10-25 ENCOUNTER — Encounter (HOSPITAL_COMMUNITY): Payer: Self-pay | Admitting: Emergency Medicine

## 2017-10-25 DIAGNOSIS — M5416 Radiculopathy, lumbar region: Secondary | ICD-10-CM | POA: Diagnosis not present

## 2017-10-25 MED ORDER — NAPROXEN 500 MG PO TABS: 500.0000 mg | ORAL_TABLET | Freq: Two times a day (BID) | ORAL | 0 refills | Status: DC

## 2017-10-25 NOTE — Discharge Instructions (Addendum)
Pain in right buttock/leg is probably due to irritation/inflammation in the low back area causing a 'pinched nerve.'  Anticipate gradual improvement in right buttock/leg pain over the next couple weeks.  Prescription for naproxen (anti inflammatory/pain reliever) was sent to the pharmacy.  Ice for 5-10 minutes several times daily may also help decrease pain.  Recheck or followup with your primary care provider if not improving as expected.

## 2017-10-25 NOTE — ED Triage Notes (Signed)
Pt c/o R hip pain x2 weeks that shoots down her R leg.

## 2017-10-25 NOTE — ED Provider Notes (Signed)
[facility]    CSN: [id number] Arrival date & time: 10/25/17  1119     History   Chief Complaint Chief Complaint  Patient presents with  . Hip Pain   Translator service used, Burmese language  HPI Cheryl Wilkerson is a 32 y.o. female.   She presents today with a 2-week history of pain that starts in her right buttock and radiates down her outer leg to the foot.  No trauma, no inciting factors identified.  Not taking anything for this.  Pain is worse when she has been sitting, and goes to stand, but better when she is up and walking.  No change in bowels or bladder, does have preserved sensation.  History of one prior episode of similar discomfort, lasted about a month, and then subsided. Patient reports history of hyperlipidemia and diabetes.    HPI  Past Medical History:  Diagnosis Date  . Medical history non-contributory     Patient Active Problem List   Diagnosis Date Noted  . Contraception management 07/17/2013    Past Surgical History:  Procedure Laterality Date  . CESAREAN SECTION N/A 06/08/2013   Procedure: CESAREAN SECTION;  Surgeon: Willodean Rosenthalarolyn Harraway-Smith, MD;  Location: [facility];  Service: Obstetrics;  Laterality: N/A;  . NO PAST SURGERIES      OB History    Gravida  3   Para  2   Term  1   Preterm  1   AB  1   Living  2     SAB  1   TAB      Ectopic      Multiple  1   Live Births  3            Home Medications    Prior to Admission medications   Medication Sig Start Date End Date Taking? Authorizing Provider  atorvastatin (LIPITOR) 40 MG tablet Take 1 tablet (40 mg total) by mouth daily. 05/03/17   Loletta SpecterGomez, Roger David, PA-C  metFORMIN (GLUCOPHAGE) 500 MG tablet Take 1 tablet (500 mg total) by mouth 2 (two) times daily with a meal. 05/08/17   Loletta SpecterGomez, Roger David, PA-C  naproxen (NAPROSYN) 500 MG tablet Take 1 tablet (500 mg total) by mouth 2 (two) times daily. 10/25/17   Isa RankinMurray, Valentino Saavedra Wilson, MD  Norgestimate-Ethinyl Estradiol  Triphasic (ORTHO TRI-CYCLEN, 28,) 0.18/0.215/0.25 MG-35 MCG tablet Take 1 tablet by mouth daily. 02/18/17   Loletta SpecterGomez, Roger David, PA-C  Salicylic Acid 2 % LIQD Apply 1 application topically daily. 02/18/17   Loletta SpecterGomez, Roger David, PA-C  tretinoin (RETIN-A) 0.05 % cream Apply topically at bedtime. 02/18/17   Loletta SpecterGomez, Roger David, PA-C  triamcinolone ointment (KENALOG) 0.5 % Apply 1 application topically 2 (two) times daily. 02/18/17   Loletta SpecterGomez, Roger David, PA-C    Family History No family history on file.  Social History Social History   Tobacco Use  . Smoking status: Passive Smoke Exposure - Never Smoker  . Smokeless tobacco: Never Used  Substance Use Topics  . Alcohol use: No  . Drug use: No     Allergies   Patient has no known allergies.   Review of Systems Review of Systems  All other systems reviewed and are negative.    Physical Exam Triage Vital Signs ED Triage Vitals  Enc Vitals Group     BP 10/25/17 1158 130/79     Pulse Rate 10/25/17 1158 90     Resp 10/25/17 1158 19     Temp 10/25/17 1221 98.9 F (  37.2 C)     Temp Source 10/25/17 1158 Oral     SpO2 10/25/17 1221 100 %     Weight --      Height --      Pain Score 10/25/17 1251 0     Pain Loc --    Updated Vital Signs BP 130/79 (BP Location: Left Arm)   Pulse 90   Temp 98.9 F (37.2 C)   Resp 19   SpO2 100%  Physical Exam  Constitutional: She is oriented to person, place, and time. No distress.  Able to walk into the urgent care independently, was able to climb onto the exam table without difficulty.  Sits in the chair in a way to slightly extend at the right hip.  HENT:  Head: Atraumatic.  Eyes:  Conjugate gaze observed, no eye redness/discharge  Neck: Neck supple.  Cardiovascular: Normal rate.  Pulmonary/Chest: No respiratory distress.  Abdominal: She exhibits no distension.  Musculoskeletal: Normal range of motion.  Site of most discomfort is located in the upper central right buttock, not  particularly tender to palpation.  Patient denies rash. Strength in the bilateral lower extremities is 5/5, proximal and distal.  Neurological: She is alert and oriented to person, place, and time.  Skin: Skin is warm and dry.  Nursing note and vitals reviewed.    UC Treatments / Results  Labs (all labs ordered are listed, but only abnormal results are displayed) Labs Reviewed - No data to display  EKG None  Radiology No results found.  Procedures Procedures (including critical care time)  Medications Ordered in UC Medications - No data to display   Final Clinical Impressions(s) / UC Diagnoses   Final diagnoses:  Acute right lumbar radiculopathy     Discharge Instructions     Pain in right buttock/leg is probably due to irritation/inflammation in the low back area causing a 'pinched nerve.'  Anticipate gradual improvement in right buttock/leg pain over the next couple weeks.  Prescription for naproxen (anti inflammatory/pain reliever) was sent to the pharmacy.  Ice for 5-10 minutes several times daily may also help decrease pain.  Recheck or followup with your primary care provider if not improving as expected.   ED Prescriptions    Medication Sig Dispense Auth. Provider   naproxen (NAPROSYN) 500 MG tablet Take 1 tablet (500 mg total) by mouth 2 (two) times daily. 30 tablet Isa Rankin, MD        Isa Rankin, MD 10/27/17 [phone]

## 2017-11-05 ENCOUNTER — Encounter (INDEPENDENT_AMBULATORY_CARE_PROVIDER_SITE_OTHER): Payer: Self-pay | Admitting: Physician Assistant

## 2017-11-05 ENCOUNTER — Ambulatory Visit (INDEPENDENT_AMBULATORY_CARE_PROVIDER_SITE_OTHER): Payer: BLUE CROSS/BLUE SHIELD | Admitting: Physician Assistant

## 2017-11-05 VITALS — BP 129/85 | HR 80 | Temp 97.9°F | Resp 18 | Ht 64.0 in | Wt 183.0 lb

## 2017-11-05 DIAGNOSIS — R7303 Prediabetes: Secondary | ICD-10-CM

## 2017-11-05 DIAGNOSIS — M5441 Lumbago with sciatica, right side: Secondary | ICD-10-CM | POA: Diagnosis not present

## 2017-11-05 MED ORDER — NAPROXEN 500 MG PO TABS: 500.0000 mg | ORAL_TABLET | Freq: Two times a day (BID) | ORAL | 0 refills | Status: DC

## 2017-11-05 MED ORDER — ACETAMINOPHEN 500 MG PO TABS: 1000.0000 mg | ORAL_TABLET | Freq: Three times a day (TID) | ORAL | 0 refills | Status: AC | PRN

## 2017-11-05 NOTE — Progress Notes (Signed)
Subjective:  Patient ID: Cheryl Wilkerson, female    DOB: 05/03/1986  Age: 32 y.o. MRN: [id number]  CC: diabetes  HPI Cheryl Wilkerson is a 32 y.o. female with a medical history of hypertriglyceridemia and prediabetes presents for A1c check. Last A1c 6.2% six months ago. A1c 5.7% today. Taking metformin as directed. Trying to eat a lower carb diet. Also states she has a one month history of lower back pain with radiculopathy down the RLE. Pain in the RLE reaches the right lateral foot and described as a constant dull pain but burning at times. Does not endorse saddle paresthesia, urinary incontinence, fecal incontinence, weakness, paralysis, CP, palpitations, SOB, HA, abdominal pain, f/c/n/v, rash, or GI/GU sxs.    Outpatient Medications Prior to Visit  Medication Sig Dispense Refill  . atorvastatin (LIPITOR) 40 MG tablet Take 1 tablet (40 mg total) by mouth daily. 90 tablet 3  . metFORMIN (GLUCOPHAGE) 500 MG tablet Take 1 tablet (500 mg total) by mouth 2 (two) times daily with a meal. 180 tablet 3  . naproxen (NAPROSYN) 500 MG tablet Take 1 tablet (500 mg total) by mouth 2 (two) times daily. 30 tablet 0  . Norgestimate-Ethinyl Estradiol Triphasic (ORTHO TRI-CYCLEN, 28,) 0.18/0.215/0.25 MG-35 MCG tablet Take 1 tablet by mouth daily. 1 Package 11  . Salicylic Acid 2 % LIQD Apply 1 application topically daily. 1 Bottle 2  . tretinoin (RETIN-A) 0.05 % cream Apply topically at bedtime. 45 g 0  . triamcinolone ointment (KENALOG) 0.5 % Apply 1 application topically 2 (two) times daily. 30 g 0   No facility-administered medications prior to visit.      ROS Review of Systems  Constitutional: Negative for chills, fever and malaise/fatigue.  Eyes: Negative for blurred vision.  Respiratory: Negative for shortness of breath.   Cardiovascular: Negative for chest pain and palpitations.  Gastrointestinal: Negative for abdominal pain and nausea.  Genitourinary: Negative for dysuria and hematuria.  Musculoskeletal:  Positive for back pain. Negative for joint pain and myalgias.  Skin: Negative for rash.  Neurological: Negative for tingling and headaches.  Psychiatric/Behavioral: Negative for depression. The patient is not nervous/anxious.     Objective:  BP 129/85 (BP Location: Left Arm, Patient Position: Sitting, Cuff Size: Normal)   Pulse 80   Temp 97.9 F (36.6 C) (Oral)   Resp 18   Ht 5\' 4"  (1.626 m)   Wt 183 lb (83 kg)   LMP 10/09/2017   SpO2 100%   BMI 31.41 kg/m   BP/Weight 11/05/2017 10/25/2017 05/01/2017  Systolic BP 129 130 120  Diastolic BP 85 79 84  Wt. (Lbs) 183 - 198  BMI 31.41 - 32.95      Physical Exam  Constitutional: She is oriented to person, place, and time.  Well developed, well nourished, NAD, polite  HENT:  Head: Normocephalic and atraumatic.  Eyes: No scleral icterus.  Neck: Normal range of motion. Neck supple. No thyromegaly present.  Cardiovascular: Normal rate, regular rhythm and normal heart sounds. Exam reveals no gallop and no friction rub.  No murmur heard. Pulmonary/Chest: Effort normal and breath sounds normal.  Musculoskeletal: She exhibits no edema.  Neurological: She is alert and oriented to person, place, and time.  SLR positive on right side. Mildly antalgic gait favoring right side.  Skin: Skin is warm and dry. No rash noted. No erythema. No pallor.  Psychiatric: She has a normal mood and affect. Her behavior is normal. Thought content normal.  Vitals reviewed.    Assessment &  Plan:    1. Acute right-sided low back pain with right-sided sciatica - DG Lumbar Spine Complete; Future - Refill naproxen (NAPROSYN) 500 MG tablet; Take 1 tablet (500 mg total) by mouth 2 (two) times daily.  Dispense: 30 tablet; Refill: 0 - Begin Tylenol Extra Strength 500 mg tablet two tablets po q8hrs x7 days PRN #21 - Ambulatory referral to Physical Therapy  2. Prediabetes - Doing well on metformin.    Meds ordered this encounter  Medications  . naproxen  (NAPROSYN) 500 MG tablet    Sig: Take 1 tablet (500 mg total) by mouth 2 (two) times daily.    Dispense:  30 tablet    Refill:  0    Order Specific Question:   Supervising Provider    Answer:   Hoy Register [[id number]]  . acetaminophen (TYLENOL) 500 MG tablet    Sig: Take 2 tablets (1,000 mg total) by mouth every 8 (eight) hours as needed for up to 7 days.    Dispense:  21 tablet    Refill:  0    Order Specific Question:   Supervising Provider    Answer:   Hoy Register [[id number]]    Follow-up: Return in about 3 months (around 02/05/2018) for after PT for sciatica f/u.   Loletta Specter PA

## 2017-11-05 NOTE — Patient Instructions (Signed)
Sciatica Sciatica is pain, numbness, weakness, or tingling along the path of the sciatic nerve. The sciatic nerve starts in the lower back and runs down the back of each leg. The nerve controls the muscles in the lower leg and in the back of the knee. It also provides feeling (sensation) to the back of the thigh, the lower leg, and the sole of the foot. Sciatica is a symptom of another medical condition that pinches or puts pressure on the sciatic nerve. Generally, sciatica only affects one side of the body. Sciatica usually goes away on its own or with treatment. In some cases, sciatica may keep coming back (recur). What are the causes? This condition is caused by pressure on the sciatic nerve, or pinching of the sciatic nerve. This may be the result of:  A disk in between the bones of the spine (vertebrae) bulging out too far (herniated disk).  Age-related changes in the spinal disks (degenerative disk disease).  A pain disorder that affects a muscle in the buttock (piriformis syndrome).  Extra bone growth (bone spur) near the sciatic nerve.  An injury or break (fracture) of the pelvis.  Pregnancy.  Tumor (rare).  What increases the risk? The following factors may make you more likely to develop this condition:  Playing sports that place pressure or stress on the spine, such as football or weight lifting.  Having poor strength and flexibility.  A history of back injury.  A history of back surgery.  Sitting for long periods of time.  Doing activities that involve repetitive bending or lifting.  Obesity.  What are the signs or symptoms? Symptoms can vary from mild to very severe, and they may include:  Any of these problems in the lower back, leg, hip, or buttock: ? Mild tingling or dull aches. ? Burning sensations. ? Sharp pains.  Numbness in the back of the calf or the sole of the foot.  Leg weakness.  Severe back pain that makes movement difficult.  These  symptoms may get worse when you cough, sneeze, or laugh, or when you sit or stand for long periods of time. Being overweight may also make symptoms worse. In some cases, symptoms may recur over time. How is this diagnosed? This condition may be diagnosed based on:  Your symptoms.  A physical exam. Your health care provider may ask you to do certain movements to check whether those movements trigger your symptoms.  You may have tests, including: ? Blood tests. ? X-rays. ? MRI. ? CT scan.  How is this treated? In many cases, this condition improves on its own, without any treatment. However, treatment may include:  Reducing or modifying physical activity during periods of pain.  Exercising and stretching to strengthen your abdomen and improve the flexibility of your spine.  Icing and applying heat to the affected area.  Medicines that help: ? To relieve pain and swelling. ? To relax your muscles.  Injections of medicines that help to relieve pain, irritation, and inflammation around the sciatic nerve (steroids).  Surgery.  Follow these instructions at home: Medicines  Take over-the-counter and prescription medicines only as told by your health care provider.  Do not drive or operate heavy machinery while taking prescription pain medicine. Managing pain  If directed, apply ice to the affected area. ? Put ice in a plastic bag. ? Place a towel between your skin and the bag. ? Leave the ice on for 20 minutes, 2-3 times a day.  After icing, apply   heat to the affected area before you exercise or as often as told by your health care provider. Use the heat source that your health care provider recommends, such as a moist heat pack or a heating pad. ? Place a towel between your skin and the heat source. ? Leave the heat on for 20-30 minutes. ? Remove the heat if your skin turns bright red. This is especially important if you are unable to feel pain, heat, or cold. You may have a  greater risk of getting burned. Activity  Return to your normal activities as told by your health care provider. Ask your health care provider what activities are safe for you. ? Avoid activities that make your symptoms worse.  Take brief periods of rest throughout the day. Resting in a lying or standing position is usually better than sitting to rest. ? When you rest for longer periods, mix in some mild activity or stretching between periods of rest. This will help to prevent stiffness and pain. ? Avoid sitting for long periods of time without moving. Get up and move around at least one time each hour.  Exercise and stretch regularly, as told by your health care provider.  Do not lift anything that is heavier than 10 lb (4.5 kg) while you have symptoms of sciatica. When you do not have symptoms, you should still avoid heavy lifting, especially repetitive heavy lifting.  When you lift objects, always use proper lifting technique, which includes: ? Bending your knees. ? Keeping the load close to your body. ? Avoiding twisting. General instructions  Use good posture. ? Avoid leaning forward while sitting. ? Avoid hunching over while standing.  Maintain a healthy weight. Excess weight puts extra stress on your back and makes it difficult to maintain good posture.  Wear supportive, comfortable shoes. Avoid wearing high heels.  Avoid sleeping on a mattress that is too soft or too hard. A mattress that is firm enough to support your back when you sleep may help to reduce your pain.  Keep all follow-up visits as told by your health care provider. This is important. Contact a health care provider if:  You have pain that wakes you up when you are sleeping.  You have pain that gets worse when you lie down.  Your pain is worse than you have experienced in the past.  Your pain lasts longer than 4 weeks.  You experience unexplained weight loss. Get help right away if:  You lose control  of your bowel or bladder (incontinence).  You have: ? Weakness in your lower back, pelvis, buttocks, or legs that gets worse. ? Redness or swelling of your back. ? A burning sensation when you urinate. This information is not intended to replace advice given to you by your health care provider. Make sure you discuss any questions you have with your health care provider. Document Released: 04/17/2001 Document Revised: 09/27/2015 Document Reviewed: 12/31/2014 Elsevier Interactive Patient Education  2018 [employer] Inc[employer]  

## 2017-11-08 ENCOUNTER — Ambulatory Visit
Admission: RE | Admit: 2017-11-08 | Discharge: 2017-11-08 | Disposition: A | Discharge status: Home / Self Care | Payer: BLUE CROSS/BLUE SHIELD | Source: Ambulatory Visit | Attending: Physician Assistant | Admitting: Physician Assistant

## 2017-11-08 DIAGNOSIS — M5441 Lumbago with sciatica, right side: Secondary | ICD-10-CM

## 2017-11-08 DIAGNOSIS — M545 Low back pain: Secondary | ICD-10-CM | POA: Diagnosis not present

## 2017-11-13 ENCOUNTER — Telehealth (INDEPENDENT_AMBULATORY_CARE_PROVIDER_SITE_OTHER): Payer: Self-pay

## 2017-11-13 NOTE — Telephone Encounter (Signed)
Called patient using pacific interpreter- patrick [phone]). No answer. Left voicemail informing patient of normal L spine XR. No evidence of lumbar spine fracture. Normal alignment. Maintained intervertebral disc spaces. Call [facility] with any questions or concerns. Maryjean Mornempestt S Roberts, CMA

## 2017-11-13 NOTE — Telephone Encounter (Signed)
-----   Message from Loletta Specteroger David Gomez, PA-C sent at 11/08/2017 11:57 AM EDT ----- Normal XR L spine. There is no evidence of lumbar spine fracture. Alignment is normal. Intervertebral disc spaces are maintained.

## 2017-11-25 ENCOUNTER — Ambulatory Visit: Payer: BLUE CROSS/BLUE SHIELD | Attending: Physician Assistant | Admitting: Physical Therapy

## 2018-02-04 ENCOUNTER — Ambulatory Visit (INDEPENDENT_AMBULATORY_CARE_PROVIDER_SITE_OTHER): Payer: BLUE CROSS/BLUE SHIELD | Admitting: Physician Assistant

## 2018-02-04 ENCOUNTER — Other Ambulatory Visit: Payer: Self-pay

## 2018-02-04 ENCOUNTER — Encounter (INDEPENDENT_AMBULATORY_CARE_PROVIDER_SITE_OTHER): Payer: Self-pay | Admitting: Physician Assistant

## 2018-02-04 VITALS — BP 120/83 | HR 77 | Temp 98.2°F | Ht 64.0 in | Wt 179.6 lb

## 2018-02-04 DIAGNOSIS — E119 Type 2 diabetes mellitus without complications: Secondary | ICD-10-CM | POA: Diagnosis not present

## 2018-02-04 DIAGNOSIS — Z23 Encounter for immunization: Secondary | ICD-10-CM

## 2018-02-04 DIAGNOSIS — M5441 Lumbago with sciatica, right side: Secondary | ICD-10-CM | POA: Diagnosis not present

## 2018-02-04 LAB — POCT GLYCOSYLATED HEMOGLOBIN (HGB A1C): HEMOGLOBIN A1C: 5.7 % — AB (ref 4.0–5.6)

## 2018-02-04 MED ORDER — NAPROXEN 500 MG PO TABS: 500.0000 mg | ORAL_TABLET | Freq: Two times a day (BID) | ORAL | 1 refills | Status: AC

## 2018-02-04 NOTE — Progress Notes (Signed)
Subjective:  Patient ID: Cheryl Wilkerson, female    DOB: Jun 18, 1985  Age: 32 y.o. MRN: [id number]  CC: f/u sciatica  HPI Cheryl Wilkerson is a 32 y.o. female with a medical history of hypertriglyceridemia and prediabetes presents on f/u of sciatica. Took Naproxen 500 mg and Tylenol 500 mg as directed with relief of back pain. Says back pain is much better but still has some persistent pain with flexion. Pt was referred to physical therapy but records show she declined physical therapy. Pt states she does not remember declining physical therapy and would like to attend. Does not endorse paresthesia, current sciatica, weakness, urinary incontinence, fecal incontinence, limited aROM of lower extremities, or swelling of the lower extremities.     Outpatient Medications Prior to Visit  Medication Sig Dispense Refill  . atorvastatin (LIPITOR) 40 MG tablet Take 1 tablet (40 mg total) by mouth daily. 90 tablet 3  . metFORMIN (GLUCOPHAGE) 500 MG tablet Take 1 tablet (500 mg total) by mouth 2 (two) times daily with a meal. 180 tablet 3  . Norgestimate-Ethinyl Estradiol Triphasic (ORTHO TRI-CYCLEN, 28,) 0.18/0.215/0.25 MG-35 MCG tablet Take 1 tablet by mouth daily. (Patient not taking: Reported on 02/04/2018) 1 Package 11  . naproxen (NAPROSYN) 500 MG tablet Take 1 tablet (500 mg total) by mouth 2 (two) times daily. 30 tablet 0  . Salicylic Acid 2 % LIQD Apply 1 application topically daily. 1 Bottle 2  . tretinoin (RETIN-A) 0.05 % cream Apply topically at bedtime. 45 g 0  . triamcinolone ointment (KENALOG) 0.5 % Apply 1 application topically 2 (two) times daily. 30 g 0   No facility-administered medications prior to visit.      ROS Review of Systems  Constitutional: Negative for chills, fever and malaise/fatigue.  Eyes: Negative for blurred vision.  Respiratory: Negative for shortness of breath.   Cardiovascular: Negative for chest pain and palpitations.  Gastrointestinal: Negative for abdominal pain and nausea.   Genitourinary: Negative for dysuria and hematuria.  Musculoskeletal: Negative for joint pain and myalgias.  Skin: Negative for rash.  Neurological: Negative for tingling and headaches.  Psychiatric/Behavioral: Negative for depression. The patient is not nervous/anxious.     Objective:  BP 120/83 (BP Location: Right Arm, Patient Position: Sitting, Cuff Size: Large)   Pulse 77   Temp 98.2 F (36.8 C) (Oral)   Ht 5\' 4"  (1.626 m)   Wt 179 lb 9.6 oz (81.5 kg)   LMP 02/04/2018 (Exact Date)   SpO2 100%   BMI 30.83 kg/m   BP/Weight 02/04/2018 11/05/2017 10/25/2017  Systolic BP 120 129 130  Diastolic BP 83 85 79  Wt. (Lbs) 179.6 183 -  BMI 30.83 31.41 -      Physical Exam  Constitutional: She is oriented to person, place, and time.  HENT:  Head: Normocephalic and atraumatic.  Eyes: Conjunctivae are normal. No scleral icterus.  Neck: No thyromegaly present.  Cardiovascular: Normal rate, regular rhythm and normal heart sounds.  Pulmonary/Chest: Effort normal and breath sounds normal.  Abdominal: Soft. Bowel sounds are normal.  Musculoskeletal: She exhibits no edema.  Full aROM of lower back. Mildly increased muscular tonicity of the lumbar paraspinals.  Lymphadenopathy:    She has no cervical adenopathy.  Neurological: She is alert and oriented to person, place, and time. No cranial nerve deficit or sensory deficit. She exhibits normal muscle tone. Coordination normal.  Nonantalgic gait  Skin: Skin is warm and dry. No rash noted.     Assessment & Plan:  1. Acute right-sided low back pain with right-sided sciatica - Refill Naproxen 500 mg BID - Second Ambulatory referral to Physical Therapy  2. Type 2 diabetes mellitus without complication, without long-term current use of insulin (HCC) - HgB A1c 5.7% today - Continue Metformin 500 mg BID - Urine microalbumin   Follow-up: 6 months  Loletta Specter PA

## 2018-02-04 NOTE — Patient Instructions (Addendum)

## 2018-02-05 LAB — MICROALBUMIN / CREATININE URINE RATIO
CREATININE, UR: 87.9 mg/dL
Microalb/Creat Ratio: 176 mg/g creat — ABNORMAL HIGH (ref 0.0–30.0)
Microalbumin, Urine: 154.7 ug/mL

## 2018-02-17 ENCOUNTER — Other Ambulatory Visit: Payer: Self-pay

## 2018-02-17 ENCOUNTER — Ambulatory Visit: Payer: BLUE CROSS/BLUE SHIELD | Attending: Physician Assistant

## 2018-02-17 DIAGNOSIS — R252 Cramp and spasm: Secondary | ICD-10-CM | POA: Diagnosis not present

## 2018-02-17 DIAGNOSIS — M79604 Pain in right leg: Secondary | ICD-10-CM

## 2018-02-17 DIAGNOSIS — M545 Low back pain, unspecified: Secondary | ICD-10-CM

## 2018-02-17 NOTE — Therapy (Signed)
[facility] [address] [place], [place], [address] Phone: [phone]   Fax:  [phone]  Physical Therapy Evaluation  Patient Details  Name: Cheryl Wilkerson MRN: [id number] Date of Birth: 01/14/1986 Referring Provider (PT): Sindy Messing , [place]    Encounter Date: 02/17/2018  PT End of Session - 02/17/18 1638    Visit Number  1    Number of Visits  4    Date for PT Re-Evaluation  03/14/18    Authorization Type  BCBS    PT Start Time  0345    PT Stop Time  0430    PT Time Calculation (min)  45 min    Activity Tolerance  Patient tolerated treatment well;No increased pain    Behavior During Therapy  WFL for tasks assessed/performed       Past Medical History:  Diagnosis Date  . Medical history non-contributory     Past Surgical History:  Procedure Laterality Date  . CESAREAN SECTION N/A 06/08/2013   Procedure: CESAREAN SECTION;  Surgeon: Willodean Rosenthal, MD;  Location: [facility];  Service: Obstetrics;  Laterality: N/A;  . NO PAST SURGERIES      There were no vitals filed for this visit.   Subjective Assessment - 02/17/18 1559    Subjective  She reports RT leg ACHE LATERAL LEG and thigh. symptoms stared  2 months ago.  Also buttock pain.   Medication helps but if wears off no benefit.     Patient is accompained by:  Interpreter    Limitations  Standing;Walking;Sitting   dressing. bending    Diagnostic tests  Xray : normal    Patient Stated Goals  decr pain    Currently in Pain?  Yes    Pain Score  3     Pain Location  Leg    Pain Orientation  Right    Pain Descriptors / Indicators  Burning;Aching    Pain Type  Chronic pain    Pain Radiating Towards  burn at ankle     Pain Onset  More than a month ago    Aggravating Factors   bending     Pain Relieving Factors  Medication,          [facility] PT Assessment - 02/17/18 0001      Assessment   Medical Diagnosis  RT sided LBP and sciatica    Referring Provider (PT)  Sindy Messing , PA     Onset Date/Surgical Date  --   2-3 months ago   Next MD Visit  April 2020    Prior Therapy  No      Precautions   Precautions  None      Restrictions   Weight Bearing Restrictions  No      Balance Screen   Has the patient fallen in the past 6 months  No      Prior Function   Level of Independence  Needs assistance with homemaking;Needs assistance with ADLs   when in pain , independent other wise   Vocation  Full time employment    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        Cognition   Overall Cognitive Status  Within Functional Limits for tasks assessed      Posture/Postural Control   Posture Comments  sit with slump      ROM / Strength   AROM / PROM / Strength  AROM;Strength      AROM   AROM Assessment  Site  Lumbar    Lumbar Flexion  65   leg ache   Lumbar Extension  30   buttock pain   Lumbar - Right Side Bend  full ROM   no incr pain   Lumbar - Left Side Bend  Full ROM   lateral hip pain     Flexibility   Soft Tissue Assessment /Muscle Length  yes    Hamstrings  70 degrees with pull through hip to thigh      Ambulation/Gait   Gait Comments  Normal                Objective measurements completed on examination: See above findings.              PT Education - 02/17/18 1638    Education Details  POC , HEP    Person(s) Educated  Patient    Methods  Explanation;Demonstration;Tactile cues;Verbal cues;Handout    Comprehension  Returned demonstration;Verbalized understanding          PT Long Term Goals - 02/17/18 1507      PT LONG TERM GOAL #1   Title  She will be independent with all HEp issued    Time  5    Period  Weeks    Status  New      PT LONG TERM GOAL #2   Title  She will report pain as intermittant in hip and leg    Time  5    Period  Weeks    Status  New      PT LONG TERM GOAL #3   Title  She will be able to perform home tasks without incr pain    Time  5    Period  Weeks    Status  New       PT LONG TERM GOAL #4   Title  She will report pain in leg as no more than 2-3 and intermittant     Time  5    Period  Weeks    Status  New      PT LONG TERM GOAL #5   Title  She will report no leg pain  with work    Time  5    Period  Weeks    Status  New             Plan - 02/17/18 1639    Clinical Impression Statement  Cheryl Wilkerson reports chronic RT hip to foot leg pain that has been worse and better over last 6-8 weeks . Pain is generally less with medication and worse without. She reports 2-3 previous episodes in past that resolved in a moth with medications.  This episode is not improveing and pain fluctuates and when worse she is not able to bend over for dressing . Marland Kitchen   She has good movements and no increased pain today . She has no tenderness in lower back, SLR is negative .  Slump sit was +.   It appears this is probably a soft tissue issue like piriformis syndrome but could be related to a disc problem.   She did not want to miss much work and agreed to [employer] for 3 more weeks to progress HEP    History and Personal Factors relevant to plan of care:  3 episodes of these symptoms.     Clinical Presentation  Unstable    Clinical Presentation due to:  chronic increased and decr RT sided hip and leg pain  Clinical Decision Making  Moderate    Rehab Potential  Fair    Clinical Impairments Affecting Rehab Potential  chronicity, language barrier    PT Frequency  1x / week    PT Duration  3 weeks    PT Treatment/Interventions  Passive range of motion;Patient/family education;Therapeutic exercise;Taping;Joint Manipulations;Iontophoresis 4mg /ml Dexamethasone    PT Next Visit Plan  review and add to stretching/HEP  Fig 4 , hamstrings, neural tension, quad stretch , hip IR str. STW to gluteals.     PT Home Exercise Plan  knee to chest RT/Lt shoulder    Consulted and Agree with Plan of Care  Patient       Patient will benefit from skilled therapeutic intervention in order to  improve the following deficits and impairments:  Pain, Increased muscle spasms  Visit Diagnosis: Low back pain radiating to right lower extremity  Cramp and spasm     Problem List Patient Active Problem List   Diagnosis Date Noted  . Contraception management 07/17/2013    Cheryl Wilkerson  PT 02/17/2018, 4:57 PM  [facility] [address] [place], [place], [address] Phone: [phone]   Fax:  [phone]  Name: Cheryl Wilkerson MRN: [id number] Date of Birth: 1985/06/28

## 2018-02-17 NOTE — Patient Instructions (Signed)
Knee to chest  To RT and LT shoulder 3x/day 2-3 reps 30 sec each

## 2018-02-28 ENCOUNTER — Encounter

## 2018-03-04 ENCOUNTER — Ambulatory Visit: Payer: BLUE CROSS/BLUE SHIELD

## 2018-03-04 DIAGNOSIS — M79604 Pain in right leg: Secondary | ICD-10-CM

## 2018-03-04 DIAGNOSIS — M545 Low back pain, unspecified: Secondary | ICD-10-CM

## 2018-03-04 DIAGNOSIS — R252 Cramp and spasm: Secondary | ICD-10-CM | POA: Diagnosis not present

## 2018-03-04 NOTE — Patient Instructions (Signed)
Figure 4 stretch, hamstring stretch. , neural tension with LAQ, RT QL stretch  2x/day 2-3 reps 30 sec.                                                                                                                                        IONTOPHORESIS PATIENT PRECAUTIONS & CONTRAINDICATIONS:  . Redness under one or both electrodes can occur.  This characterized by a uniform redness that usually disappears within 12 hours of treatment. . Small pinhead size blisters may result in response to the drug.  Contact your physician if the problem persists more than 24 hours. . On rare occasions, iontophoresis therapy can result in temporary skin reactions such as rash, inflammation, irritation or burns.  The skin reactions may be the result of individual sensitivity to the ionic solution used, the condition of the skin at the start of treatment, reaction to the materials in the electrodes, allergies or sensitivity to dexamethasone, or a poor connection between the patch and your skin.  Discontinue using iontophoresis if you have any of these reactions and report to your therapist. . Remove the Patch or electrodes if you have any undue sensation of pain or burning during the treatment and report discomfort to your therapist. . Tell your Therapist if you have had known adverse reactions to the application of electrical current. . If using the Patch, the LED light will turn off when treatment is complete and the patch can be removed.  Approximate treatment time is 1-3 hours.  Remove the patch when light goes off or after 6 hours. . The Patch can be worn during normal activity, however excessive motion where the electrodes have been placed can cause poor contact between the skin and the electrode or uneven electrical current resulting in greater risk of skin irritation. Marland Kitchen Keep out of the reach of children.   . DO NOT use if you have a cardiac pacemaker or any other electrically sensitive implanted device. . DO  NOT use if you have a known sensitivity to dexamethasone. . DO NOT use during Magnetic Resonance Imaging (MRI). . DO NOT use over broken or compromised skin (e.g. sunburn, cuts, or acne) due to the increased risk of skin reaction. . DO NOT SHAVE over the area to be treated:  To establish good contact between the Patch and the skin, excessive hair may be clipped. . DO NOT place the Patch or electrodes on or over your eyes, directly over your heart, or brain. . DO NOT reuse the Patch or electrodes as this may cause burns to occur.

## 2018-03-04 NOTE — Therapy (Signed)
[place], [place], [address] Phone: [phone]   Fax:  [phone]  Physical Therapy Treatment  Patient Details  Name: Alania Overholt MRN: [id number] Date of Birth: 06-Mar-1986 Referring Provider (PT): Domenica Fail , [place]    Encounter Date: 03/04/2018  PT End of Session - 03/04/18 1526    Visit Number  2    Number of Visits  4    Date for PT Re-Evaluation  03/14/18    Authorization Type  BCBS    PT Start Time  0330    PT Stop Time  0408    PT Time Calculation (min)  38 min    Activity Tolerance  Patient tolerated treatment well;No increased pain    Behavior During Therapy  WFL for tasks assessed/performed       Past Medical History:  Diagnosis Date  . Medical history non-contributory     Past Surgical History:  Procedure Laterality Date  . CESAREAN SECTION N/A 06/08/2013   Procedure: CESAREAN SECTION;  Surgeon: Lavonia Drafts, MD;  Location: [facility];  Service: Obstetrics;  Laterality: N/A;  . NO PAST SURGERIES      There were no vitals filed for this visit.  Subjective Assessment - 03/04/18 1532    Subjective  Pain is getting better. Stretching helps    Patient is accompained by:  Interpreter    Currently in Pain?  No/denies                       [facility] Adult PT Treatment/Exercise - 03/04/18 0001      Self-Care   Self-Care  Other Self-Care Comments    Other Self-Care Comments   tennis ball To RT gluteals  in supine and instructed pt in pressure management and position.       Exercises   Exercises  Lumbar;Knee/Hip      Lumbar Exercises: Stretches   Other Lumbar Stretch Exercise  sitting QL stretch x 2 60 sec.       Knee/Hip Exercises: Stretches   Passive Hamstring Stretch  Right;2 reps;30 seconds    Passive Hamstring Stretch Limitations  strap    Piriformis Stretch  Right;2 reps;60 seconds      Modalities   Modalities  Iontophoresis      Iontophoresis   Type of  Iontophoresis  Dexamethasone    Location  RT lateral sacrum    Dose  1cc    Time  4-6 hours      Manual Therapy   Manual Therapy  Manual Traction;Soft tissue mobilization    Soft tissue mobilization  RT gluteals     Manual Traction  long axis pulls Gr 2-3 x 100 reps RT leg. She reported som discomfrt lateral posterior knee with pull that stopped after pull stopped             PT Education - 03/04/18 1613    Education Details  HEP , ionto management.     Person(s) Educated  Patient    Methods  Explanation;Demonstration;Tactile cues;Verbal cues;Handout    Comprehension  Verbalized understanding;Returned demonstration          PT Long Term Goals - 03/04/18 1608      PT LONG TERM GOAL #1   Title  She will be independent with all HEp issued    Baseline  independent with initial HEP    Status  Partially Met      PT LONG TERM GOAL #2   Title  She will report pain as intermittant in hip and leg    Status  Achieved      PT LONG TERM GOAL #3   Title  She will be able to perform home tasks without incr pain    Status  On-going      PT LONG TERM GOAL #4   Title  She will report pain in leg as no more than 2-3 and intermittant     Status  On-going      PT LONG TERM GOAL #5   Title  She will report no leg pain  with work    Status  On-going            Plan - 03/04/18 1527    Clinical Impression Statement  She is improved with stretching but still tender along RT lateral sacrum/SI area.  She was able to demo HEP correctly and agreed to do new exercise. Cautioned her to ease off if pain increases and should be more of pulling soreness or no pain. She will try tennis ball to RT gluteals. Ionto trial and she verbalized removal with irritation and if no irritation around 9 tonite    PT Treatment/Interventions  Passive range of motion;Patient/family education;Therapeutic exercise;Taping;Joint Manipulations;Iontophoresis '4mg'$ /ml Dexamethasone    PT Next Visit Plan  Review HEP  and add as needed , modalities and manual if needed    PT Home Exercise Plan  knee to chest RT/Lt shoulder. Fig 4 , Hamstring stretch, neural tension LAQ, QL stretch to Lt.     Consulted and Agree with Plan of Care  Patient       Patient will benefit from skilled therapeutic intervention in order to improve the following deficits and impairments:  Pain, Increased muscle spasms  Visit Diagnosis: Cramp and spasm  Low back pain radiating to right lower extremity     Problem List Patient Active Problem List   Diagnosis Date Noted  . Contraception management 07/17/2013    Darrel Hoover  PT 03/04/2018, 4:16 PM  [facility] [address] [place], [place], [address] Phone: [phone]   Fax:  [phone]  Name: Assunta Pupo MRN: [id number] Date of Birth: [birth year]

## 2018-03-11 ENCOUNTER — Encounter: Payer: Self-pay | Admitting: Physical Therapy

## 2018-03-11 ENCOUNTER — Ambulatory Visit: Payer: BLUE CROSS/BLUE SHIELD | Attending: Physician Assistant | Admitting: Physical Therapy

## 2018-03-11 DIAGNOSIS — M545 Low back pain, unspecified: Secondary | ICD-10-CM

## 2018-03-11 DIAGNOSIS — R252 Cramp and spasm: Secondary | ICD-10-CM | POA: Insufficient documentation

## 2018-03-11 DIAGNOSIS — M79604 Pain in right leg: Secondary | ICD-10-CM

## 2018-03-11 NOTE — Therapy (Addendum)
[place], [place], [address] Phone: [phone]   Fax:  [phone]  Physical Therapy Treatment/Discharge  Patient Details  Name: Cheryl Wilkerson MRN: [id number] Date of Birth: 11-Aug-1985 Referring Provider (PT): Domenica Fail , [place]    Encounter Date: 03/11/2018  PT End of Session - 03/11/18 1557    Visit Number  3    Number of Visits  4    Date for PT Re-Evaluation  03/14/18    Authorization Type  BCBS    PT Start Time  0345    PT Stop Time  0430    PT Time Calculation (min)  45 min       Past Medical History:  Diagnosis Date  . Medical history non-contributory     Past Surgical History:  Procedure Laterality Date  . CESAREAN SECTION N/A 06/08/2013   Procedure: CESAREAN SECTION;  Surgeon: Lavonia Drafts, MD;  Location: [facility];  Service: Obstetrics;  Laterality: N/A;  . NO PAST SURGERIES      There were no vitals filed for this visit.  Subjective Assessment - 03/11/18 1549    Subjective  Had pain in leg dow n to ankle after last visit. Better now but sometimes numb at lateral ankle. No pain.    Currently in Pain?  No/denies    Pain Radiating Towards  numb at lateral right ankle intermittently                       [facility] Adult PT Treatment/Exercise - 03/11/18 0001      Exercises   Exercises  Lumbar      Lumbar Exercises: Stretches   Other Lumbar Stretch Exercise  sidelying QL stretch over pillow roll x 60 sec     Passive Hamstring Stretch  Right;2 reps;30 seconds    Passive Hamstring Stretch Limitations  strap    Piriformis Stretch  Right;2 reps;60 seconds      Lumbar Exercises: Supine   Ab Set  10 reps    Pelvic Tilt  10 reps    Heel Slides  10 reps    Bridge  5 reps    Bridge Limitations  right posterior leg pulling     Other Supine Lumbar Exercises  table top holds 10 sec x 5            --      --      --      --             PT Education - 03/11/18 1626     Education Details  Table Top holds    Person(s) Educated  Patient    Methods  Explanation;Handout    Comprehension  Verbalized understanding          PT Long Term Goals - 03/11/18 1635      PT LONG TERM GOAL #1   Title  She will be independent with all HEp issued    Time  5    Period  Weeks    Status  Achieved      PT LONG TERM GOAL #2   Title  She will report pain as intermittant in hip and leg    Time  5    Period  Weeks    Status  Achieved      PT LONG TERM GOAL #3   Title  She will be able to perform home tasks without incr pain  Time  5    Period  Weeks    Status  Achieved      PT LONG TERM GOAL #4   Title  She will report pain in leg as no more than 2-3 and intermittant     Time  5    Period  Weeks    Status  Achieved      PT LONG TERM GOAL #5   Title  She will report no leg pain  with work    Time  5    Period  Weeks    Status  Achieved            Plan - 03/11/18 1631    Clinical Impression Statement  Languarge barrier even via interpreter however pt reports some leg pain after last visit but it resolved. The exercises have helped, the ionto patch was helpful. She reports no longer has leg pain and only intermittent lateral ankle pain. Advanced to core strength with table top holds without increased LBP. Updated HEP. Asked her to discontinue these if caused increased pain. At end of session she reports no other appointments scheduled and she requests to discharge. She has met all LTGs. She reports leg pain resolved and she has no limitations at home or work. Only lateral ankle numbness intermittently.     PT Next Visit Plan  dischrage today    PT Home Exercise Plan  knee to chest RT/Lt shoulder. Fig 4 , Hamstring stretch, neural tension LAQ, QL stretch to Lt. , table top holds     Consulted and Agree with Plan of Care  Patient       Patient will benefit from skilled therapeutic intervention in order to improve the following deficits and  impairments:  Pain, Increased muscle spasms  Visit Diagnosis: Cramp and spasm  Low back pain radiating to right lower extremity     Problem List Patient Active Problem List   Diagnosis Date Noted  . Contraception management 07/17/2013    Dorene Ar, PTA 03/11/2018, 4:39 PM  [place], [place], [address] Phone: [phone]   Fax:  [phone]  Name: Cheryl Wilkerson MRN: [id number] Date of Birth: Oct 04, 1985  PHYSICAL THERAPY DISCHARGE SUMMARY  Visits from Start of Care: 3  Current functional level related to goals / functional outcomes: See above  Remaining deficits: See above   Education / Equipment: HEP Plan: Patient agrees to discharge.  Patient goals were met. Patient is being discharged due to the patient's request.  ?????    Pearson Forster PT    03/19/18

## 2018-08-06 ENCOUNTER — Ambulatory Visit (INDEPENDENT_AMBULATORY_CARE_PROVIDER_SITE_OTHER): Payer: BLUE CROSS/BLUE SHIELD | Admitting: Primary Care

## 2018-08-22 ENCOUNTER — Other Ambulatory Visit: Payer: Self-pay

## 2018-08-22 ENCOUNTER — Encounter: Payer: Self-pay | Admitting: Primary Care

## 2018-08-22 ENCOUNTER — Ambulatory Visit: Payer: BLUE CROSS/BLUE SHIELD | Attending: Primary Care | Admitting: Primary Care

## 2018-08-22 VITALS — BP 125/90 | HR 68 | Temp 97.8°F | Resp 18

## 2018-08-22 DIAGNOSIS — L709 Acne, unspecified: Secondary | ICD-10-CM

## 2018-08-22 DIAGNOSIS — I10 Essential (primary) hypertension: Secondary | ICD-10-CM

## 2018-08-22 DIAGNOSIS — R7303 Prediabetes: Secondary | ICD-10-CM | POA: Diagnosis not present

## 2018-08-22 DIAGNOSIS — E782 Mixed hyperlipidemia: Secondary | ICD-10-CM | POA: Diagnosis not present

## 2018-08-22 DIAGNOSIS — M79604 Pain in right leg: Secondary | ICD-10-CM | POA: Diagnosis not present

## 2018-08-22 DIAGNOSIS — E669 Obesity, unspecified: Secondary | ICD-10-CM

## 2018-08-22 DIAGNOSIS — Z3041 Encounter for surveillance of contraceptive pills: Secondary | ICD-10-CM

## 2018-08-22 LAB — POCT GLYCOSYLATED HEMOGLOBIN (HGB A1C): Hemoglobin A1C: 5.9 % — AB (ref 4.0–5.6)

## 2018-08-22 LAB — GLUCOSE, POCT (MANUAL RESULT ENTRY): POC Glucose: 93 mg/dl (ref 70–99)

## 2018-08-22 MED ORDER — NORGESTIM-ETH ESTRAD TRIPHASIC 0.18/0.215/0.25 MG-35 MCG PO TABS: 1.0000 | ORAL_TABLET | Freq: Every day | ORAL | 11 refills | Status: DC

## 2018-08-22 MED ORDER — NORGESTIM-ETH ESTRAD TRIPHASIC 0.18/0.215/0.25 MG-35 MCG PO TABS: 1.0000 | ORAL_TABLET | Freq: Every day | ORAL | 11 refills | Status: AC

## 2018-08-22 NOTE — Progress Notes (Signed)
Patient verified DOB Patient has not taken medication today. Patient has not eaten today. Patient complains of chronic pain in the right leg being present for for years and beginning at the waist and radiates down to the toes. Patient is needing refills

## 2018-08-22 NOTE — Progress Notes (Signed)
Established Patient Office Visit  Subjective:  Patient ID: Cheryl Wilkerson, female    DOB: 05-Apr-1986  Age: 33 y.o. MRN: [id number]  CC:  Chief Complaint  Patient presents with  . Diabetes   Diabetes  She presents for her follow-up diabetic visit. She has type 2 diabetes mellitus. No MedicAlert identification noted. Her disease course has been stable. There are no hypoglycemic associated symptoms. There are no diabetic associated symptoms. There are no hypoglycemic complications. Symptoms are stable. There are no diabetic complications. Risk factors for coronary artery disease include diabetes mellitus.   HPI Tkai Nowland presents for to establish care ,follow up on diabetes and has  concerns with right leg hurting a lot. Asked type of work she works from 7:30 -5: 30 in a sitting position allowed a 15 min break and a 30 min lunch.   Past Medical History:  Diagnosis Date  . Medical history non-contributory     Past Surgical History:  Procedure Laterality Date  . CESAREAN SECTION N/A 06/08/2013   Procedure: CESAREAN SECTION;  Surgeon: Willodean Rosenthal, MD;  Location: [facility];  Service: Obstetrics;  Laterality: N/A;  . NO PAST SURGERIES      History reviewed. No pertinent family history.  Social History   Socioeconomic History  . Marital status: Married    Spouse name: Not on file  . Number of children: Not on file  . Years of education: Not on file  . Highest education level: Not on file  Occupational History  . Not on file  Social Needs  . Financial resource strain: Not on file  . Food insecurity:    Worry: Not on file    Inability: Not on file  . Transportation needs:    Medical: Not on file    Non-medical: Not on file  Tobacco Use  . Smoking status: Passive Smoke Exposure - Never Smoker  . Smokeless tobacco: Never Used  Substance and Sexual Activity  . Alcohol use: No  . Drug use: No  . Sexual activity: Not Currently    Birth control/protection: None  Lifestyle  .  Physical activity:    Days per week: Not on file    Minutes per session: Not on file  . Stress: Not on file  Relationships  . Social connections:    Talks on phone: Not on file    Gets together: Not on file    Attends religious service: Not on file    Active member of club or organization: Not on file    Attends meetings of clubs or organizations: Not on file    Relationship status: Not on file  . Intimate partner violence:    Fear of current or ex partner: Not on file    Emotionally abused: Not on file    Physically abused: Not on file    Forced sexual activity: Not on file  Other Topics Concern  . Not on file  Social History Narrative  . Not on file    Outpatient Medications Prior to Visit  Medication Sig Dispense Refill  . atorvastatin (LIPITOR) 40 MG tablet Take 1 tablet (40 mg total) by mouth daily. (Patient not taking: Reported on 08/22/2018) 90 tablet 3  . metFORMIN (GLUCOPHAGE) 500 MG tablet Take 1 tablet (500 mg total) by mouth 2 (two) times daily with a meal. (Patient not taking: Reported on 08/22/2018) 180 tablet 3  . naproxen (NAPROSYN) 500 MG tablet Take 1 tablet (500 mg total) by mouth 2 (two) times daily. (Patient  not taking: Reported on 08/22/2018) 30 tablet 1  . Norgestimate-Ethinyl Estradiol Triphasic (ORTHO TRI-CYCLEN, 28,) 0.18/0.215/0.25 MG-35 MCG tablet Take 1 tablet by mouth daily. (Patient not taking: Reported on 02/04/2018) 1 Package 11   No facility-administered medications prior to visit.     No Known Allergies  ROS Review of Systems  Constitutional: Negative.   HENT: Negative.   Eyes: Negative.   Respiratory: Negative.   Cardiovascular: Negative.   Gastrointestinal: Negative.   Endocrine: Negative.   Genitourinary: Negative.   Musculoskeletal: Negative.        Right leg pain unknown etiology  Skin: Negative.   Allergic/Immunologic: Negative.   Neurological: Negative.   Hematological: Negative.   Psychiatric/Behavioral: Negative.        Objective:    Physical Exam  Constitutional: She is oriented to person, place, and time. She appears well-developed and well-nourished.  HENT:  Head: Normocephalic and atraumatic.  Neck: Normal range of motion. Neck supple.  Cardiovascular: Normal rate and regular rhythm.  Pulmonary/Chest: Effort normal and breath sounds normal.  Abdominal: She exhibits distension.  Musculoskeletal: Normal range of motion.     Comments: Right leg pain maybe due to sedentary working environment . There is a bone that protrudes at the lateral malleolus . Asked how and when this occur stated it happen in [place]  Neurological: She is alert and oriented to person, place, and time.  Skin: Skin is warm and dry.  Psychiatric: She has a normal mood and affect.    BP 125/90 (BP Location: Right Arm, Patient Position: Sitting, Cuff Size: Normal)   Pulse 68   Temp 97.8 F (36.6 C) (Oral)   Resp 18   LMP 08/12/2018   SpO2 100%  Wt Readings from Last 3 Encounters:  02/04/18 179 lb 9.6 oz (81.5 kg)  11/05/17 183 lb (83 kg)  05/01/17 198 lb (89.8 kg)     There are no preventive care reminders to display for this patient.  There are no preventive care reminders to display for this patient.  No results found for: TSH Lab Results  Component Value Date   WBC 7.9 05/01/2017   HGB 12.3 05/01/2017   HCT 38.5 05/01/2017   MCV 82 05/01/2017   PLT 237 05/01/2017   Lab Results  Component Value Date   NA 140 05/01/2017   K 3.8 05/01/2017   CO2 19 (L) 05/01/2017   GLUCOSE 158 (H) 05/01/2017   BUN 11 05/01/2017   CREATININE 0.89 05/01/2017   BILITOT <0.2 05/01/2017   ALKPHOS 70 05/01/2017   AST 16 05/01/2017   ALT 16 05/01/2017   PROT 7.6 05/01/2017   ALBUMIN 3.9 05/01/2017   CALCIUM 9.2 05/01/2017   Lab Results  Component Value Date   CHOL 211 (H) 05/01/2017   Lab Results  Component Value Date   HDL 49 05/01/2017   Lab Results  Component Value Date   LDLCALC 105 (H) 05/01/2017   Lab  Results  Component Value Date   TRIG 287 (H) 05/01/2017   Lab Results  Component Value Date   CHOLHDL 4.3 05/01/2017   Lab Results  Component Value Date   HGBA1C 5.9 (A) 08/22/2018      Assessment & Plan:   Problem List Items Addressed This Visit    Contraception management    Other Visit Diagnoses    Prediabetes    -  Primary   Relevant Orders   Microalbumin / creatinine urine ratio   HgB A1c (Completed)   Glucose (CBG) (  Completed)   Mixed hyperlipidemia       Relevant Orders   Lipid panel   Obesity (BMI 30-39.9)       Essential hypertension       Relevant Orders   CBC with Differential/Platelet   Comprehensive metabolic panel   Right leg pain       Acne, unspecified acne type       Relevant Medications   Norgestimate-Ethinyl Estradiol Triphasic (ORTHO TRI-CYCLEN, 28,) 0.18/0.215/0.25 MG-35 MCG tablet    Monay was seen today for diabetes.  Diagnoses and all orders for this visit:  Prediabetes A1C 5.7  She presents for her follow-up diabetic visit. She has type 2 diabetes mellitus. No MedicAlert identification noted. Her disease course has been stable. There are no hypoglycemic associated symptoms. There are no diabetic associated symptoms. There are no hypoglycemic complications. Symptoms are stable. There are no diabetic complications. Risk factors for coronary artery disease include diabetes mellitus.   Mixed hyperlipidemia  Will order a FLP and re-evaluate for  tx  After reviewed   Obesity (BMI 30-39.9) Discussed weight loss with exercise and decreasing portion sizes . She eats rice 3-4 times a day just emphasize portion size  Essential hypertension Unremarkable on no medication no tx required   Right leg pain Etiology unknown or injury to same leg. She sit most of the day advised to stand or move around to increase circulation. If employer has a issue will send in a note  Encounter for surveillance of contraceptive pills Refill oral contraceptions     Meds ordered this encounter  Medications  . DISCONTD: Norgestimate-Ethinyl Estradiol Triphasic (ORTHO TRI-CYCLEN, 28,) 0.18/0.215/0.25 MG-35 MCG tablet    Sig: Take 1 tablet by mouth daily.    Dispense:  1 Package    Refill:  11  . Norgestimate-Ethinyl Estradiol Triphasic (ORTHO TRI-CYCLEN, 28,) 0.18/0.215/0.25 MG-35 MCG tablet    Sig: Take 1 tablet by mouth daily.    Dispense:  1 Package    Refill:  11    Follow-up: Return in about 6 months (around 02/21/2019) for DM.    Grayce SessionsMichelle P Nettie Cromwell, NP

## 2018-08-23 LAB — CBC WITH DIFFERENTIAL/PLATELET
Basophils Absolute: 0 10*3/uL (ref 0.0–0.2)
Basos: 0 %
EOS (ABSOLUTE): 0.1 10*3/uL (ref 0.0–0.4)
Eos: 1 %
Hematocrit: 36.1 % (ref 34.0–46.6)
Hemoglobin: 11.5 g/dL (ref 11.1–15.9)
Immature Grans (Abs): 0 10*3/uL (ref 0.0–0.1)
Immature Granulocytes: 0 %
Lymphocytes Absolute: 2.8 10*3/uL (ref 0.7–3.1)
Lymphs: 33 %
MCH: 25.3 pg — ABNORMAL LOW (ref 26.6–33.0)
MCHC: 31.9 g/dL (ref 31.5–35.7)
MCV: 79 fL (ref 79–97)
Monocytes Absolute: 0.5 10*3/uL (ref 0.1–0.9)
Monocytes: 6 %
Neutrophils Absolute: 5 10*3/uL (ref 1.4–7.0)
Neutrophils: 60 %
Platelets: 275 10*3/uL (ref 150–450)
RBC: 4.55 x10E6/uL (ref 3.77–5.28)
RDW: 14.1 % (ref 11.7–15.4)
WBC: 8.4 10*3/uL (ref 3.4–10.8)

## 2018-08-23 LAB — COMPREHENSIVE METABOLIC PANEL
ALT: 25 IU/L (ref 0–32)
AST: 21 IU/L (ref 0–40)
Albumin/Globulin Ratio: 1.2 (ref 1.2–2.2)
Albumin: 4.1 g/dL (ref 3.8–4.8)
Alkaline Phosphatase: 87 IU/L (ref 39–117)
BUN/Creatinine Ratio: 17 (ref 9–23)
BUN: 12 mg/dL (ref 6–20)
Bilirubin Total: 0.2 mg/dL (ref 0.0–1.2)
CO2: 21 mmol/L (ref 20–29)
Calcium: 9.2 mg/dL (ref 8.7–10.2)
Chloride: 105 mmol/L (ref 96–106)
Creatinine, Ser: 0.71 mg/dL (ref 0.57–1.00)
GFR calc Af Amer: 129 mL/min/{1.73_m2} (ref >59)
GFR calc non Af Amer: 111 mL/min/{1.73_m2} (ref >59)
Globulin, Total: 3.3 g/dL (ref 1.5–4.5)
Glucose: 97 mg/dL (ref 65–99)
Potassium: 4.4 mmol/L (ref 3.5–5.2)
Sodium: 140 mmol/L (ref 134–144)
Total Protein: 7.4 g/dL (ref 6.0–8.5)

## 2018-08-23 LAB — MICROALBUMIN / CREATININE URINE RATIO
Creatinine, Urine: 133.3 mg/dL
Microalb/Creat Ratio: 17 mg/g creat (ref 0–29)
Microalbumin, Urine: 23.2 ug/mL

## 2018-08-23 LAB — LIPID PANEL
Chol/HDL Ratio: 3.9 ratio (ref 0.0–4.4)
Cholesterol, Total: 181 mg/dL (ref 100–199)
HDL: 47 mg/dL (ref >39)
LDL Calculated: 115 mg/dL — ABNORMAL HIGH (ref 0–99)
Triglycerides: 93 mg/dL (ref 0–149)
VLDL Cholesterol Cal: 19 mg/dL (ref 5–40)

## 2018-08-25 ENCOUNTER — Telehealth: Payer: Self-pay | Admitting: *Deleted

## 2018-08-25 NOTE — Telephone Encounter (Signed)
-----   Message from Grayce Sessions, NP sent at 08/24/2018  6:09 PM EDT ----- Labs have improved except A1C went up .2pts discuss reduction in carbs rice eat 1/2 the serving size

## 2018-08-25 NOTE — Telephone Encounter (Signed)
Medical Assistant left message on patient's home and cell voicemail. Voicemail states to give a call back to [place] with [facility] at [phone]. Patient is aware of labs being normal and to continue with current medications, increasing water, limiting bread rice and pasta and sugar treats or drinks.

## 2019-08-01 ENCOUNTER — Ambulatory Visit: Payer: Self-pay | Attending: Internal Medicine

## 2019-08-01 ENCOUNTER — Ambulatory Visit: Payer: BLUE CROSS/BLUE SHIELD

## 2019-08-01 DIAGNOSIS — Z23 Encounter for immunization: Secondary | ICD-10-CM

## 2019-08-01 NOTE — Progress Notes (Signed)
   Covid-19 Vaccination Clinic  Name:  Cicily Bonano    MRN: [id number] DOB: 08-10-1985  08/01/2019  Ms. Grabe was observed post Covid-19 immunization for 15 minutes without incident. She was provided with Vaccine Information Sheet and instruction to access the V-Safe system.   Ms. Zylee was instructed to call 911 with any severe reactions post vaccine: Marland Kitchen Difficulty breathing  . Swelling of face and throat  . A fast heartbeat  . A bad rash all over body  . Dizziness and weakness

## 2021-05-10 ENCOUNTER — Ambulatory Visit: Payer: Self-pay | Admitting: Internal Medicine
